# Patient Record
Sex: Male | Born: 1940 | Race: White | Hispanic: No | Marital: Married | Smoking: Never smoker
Health system: Southern US, Community
[De-identification: ages and names within clinical notes are randomized; demographics above are authoritative.]

## PROBLEM LIST (undated history)

## (undated) DIAGNOSIS — C61 Malignant neoplasm of prostate: Secondary | ICD-10-CM

## (undated) DIAGNOSIS — Z860100 Personal history of colon polyps, unspecified: Secondary | ICD-10-CM

## (undated) DIAGNOSIS — Z8673 Personal history of transient ischemic attack (TIA), and cerebral infarction without residual deficits: Secondary | ICD-10-CM

## (undated) DIAGNOSIS — Z974 Presence of external hearing-aid: Secondary | ICD-10-CM

## (undated) DIAGNOSIS — K6289 Other specified diseases of anus and rectum: Secondary | ICD-10-CM

## (undated) DIAGNOSIS — N281 Cyst of kidney, acquired: Secondary | ICD-10-CM

## (undated) DIAGNOSIS — K5730 Diverticulosis of large intestine without perforation or abscess without bleeding: Secondary | ICD-10-CM

## (undated) DIAGNOSIS — K219 Gastro-esophageal reflux disease without esophagitis: Secondary | ICD-10-CM

## (undated) DIAGNOSIS — I728 Aneurysm of other specified arteries: Secondary | ICD-10-CM

## (undated) DIAGNOSIS — M1990 Unspecified osteoarthritis, unspecified site: Secondary | ICD-10-CM

## (undated) DIAGNOSIS — Z7901 Long term (current) use of anticoagulants: Secondary | ICD-10-CM

## (undated) DIAGNOSIS — Z87442 Personal history of urinary calculi: Secondary | ICD-10-CM

## (undated) DIAGNOSIS — E781 Pure hyperglyceridemia: Secondary | ICD-10-CM

## (undated) HISTORY — PX: PROSTATE BIOPSY: SHX241

## (undated) HISTORY — PX: CYSTOSCOPY WITH RETROGRADE PYELOGRAM, URETEROSCOPY AND STENT PLACEMENT: SHX5789

## (undated) HISTORY — PX: EXTRACORPOREAL SHOCK WAVE LITHOTRIPSY: SHX1557

## (undated) HISTORY — DX: Aneurysm of other specified arteries: I72.8

## (undated) HISTORY — DX: Unspecified osteoarthritis, unspecified site: M19.90

## (undated) HISTORY — PX: OTHER SURGICAL HISTORY: SHX169

---

## 2001-01-15 ENCOUNTER — Ambulatory Visit (HOSPITAL_COMMUNITY): Admission: RE | Admit: 2001-01-15 | Discharge: 2001-01-15 | Payer: Self-pay | Admitting: Internal Medicine

## 2001-01-15 ENCOUNTER — Encounter: Payer: Self-pay | Admitting: Internal Medicine

## 2002-05-27 ENCOUNTER — Emergency Department (HOSPITAL_COMMUNITY): Admission: EM | Admit: 2002-05-27 | Discharge: 2002-05-27 | Payer: Self-pay | Admitting: Emergency Medicine

## 2002-12-01 ENCOUNTER — Encounter: Payer: Self-pay | Admitting: Internal Medicine

## 2002-12-01 ENCOUNTER — Ambulatory Visit (HOSPITAL_COMMUNITY): Admission: RE | Admit: 2002-12-01 | Discharge: 2002-12-01 | Payer: Self-pay | Admitting: Internal Medicine

## 2002-12-02 ENCOUNTER — Encounter: Payer: Self-pay | Admitting: Internal Medicine

## 2002-12-02 ENCOUNTER — Ambulatory Visit (HOSPITAL_COMMUNITY): Admission: RE | Admit: 2002-12-02 | Discharge: 2002-12-02 | Payer: Self-pay | Admitting: Internal Medicine

## 2003-03-10 ENCOUNTER — Encounter: Payer: Self-pay | Admitting: Family Medicine

## 2003-03-10 ENCOUNTER — Ambulatory Visit (HOSPITAL_COMMUNITY): Admission: RE | Admit: 2003-03-10 | Discharge: 2003-03-10 | Payer: Self-pay | Admitting: Family Medicine

## 2005-02-07 ENCOUNTER — Other Ambulatory Visit: Admission: RE | Admit: 2005-02-07 | Discharge: 2005-02-07 | Payer: Self-pay | Admitting: Unknown Physician Specialty

## 2005-07-05 ENCOUNTER — Ambulatory Visit (HOSPITAL_COMMUNITY): Admission: RE | Admit: 2005-07-05 | Discharge: 2005-07-05 | Payer: Self-pay | Admitting: Urology

## 2005-07-25 ENCOUNTER — Ambulatory Visit (HOSPITAL_COMMUNITY): Admission: RE | Admit: 2005-07-25 | Discharge: 2005-07-25 | Payer: Self-pay | Admitting: Urology

## 2005-07-25 ENCOUNTER — Ambulatory Visit (HOSPITAL_BASED_OUTPATIENT_CLINIC_OR_DEPARTMENT_OTHER): Admission: RE | Admit: 2005-07-25 | Discharge: 2005-07-25 | Payer: Self-pay | Admitting: Urology

## 2006-08-12 ENCOUNTER — Encounter (INDEPENDENT_AMBULATORY_CARE_PROVIDER_SITE_OTHER): Payer: Self-pay | Admitting: Specialist

## 2006-08-12 ENCOUNTER — Ambulatory Visit (HOSPITAL_COMMUNITY): Admission: RE | Admit: 2006-08-12 | Discharge: 2006-08-12 | Payer: Self-pay | Admitting: General Surgery

## 2006-08-12 HISTORY — PX: COLONOSCOPY: SHX174

## 2007-08-12 ENCOUNTER — Ambulatory Visit (HOSPITAL_COMMUNITY): Admission: RE | Admit: 2007-08-12 | Discharge: 2007-08-12 | Payer: Self-pay | Admitting: Family Medicine

## 2010-12-14 NOTE — Procedures (Signed)
   NAME:  Louis Barnett, Louis Barnett NO.:  [id number]   MEDICAL RECORD NO.:  [id number]                   PATIENT TYPE:  OUT   LOCATION:  RAD                                  FACILITY:  [facility]   PHYSICIAN:  Darlin Priestly, M.D.             DATE OF BIRTH:  1940-11-04   DATE OF PROCEDURE:  12/02/2002  DATE OF DISCHARGE:                                  ECHOCARDIOGRAM   INDICATIONS:  The patient is a 70 year old male patient of Dr. Artis Delay  with a history of TIA.  He is now referred for 2-D echocardiogram to  evaluate LV function, valvular structures, and rule out intracardiac  thrombus.   FINDINGS:  The aorta is within normal limits at 3.1 cm.   The left atrium is within normal limits at 3.7 cm.  There are not clots  seen.  The patient is in sinus rhythm during procedure.   IVS and LAD are within normal limits of 1.1 and 1.0 cm, respectively.   The aortic valve appears to be mildly thickened with no evidence of  significant aortic stenosis and trivial to mild aortic regurgitation.   There is mild thickening of the anterior and posterior mitral valve leaflets  with trivial mitral regurgitation.   Structurally normal tricuspid valve with mild tricuspid regurgitation.   Left ventricular internal dimensions within normal limits at 4.2 and 3.3 cm,  respectively.  There is good overall left ventricular function, estimated EF  of 60% with no segmental wall motion abnormalities visualized.   Mild RV enlargement with normal RV systolic function.   CONCLUSIONS:  1. Normal left ventricular size and systolic function estimated ejection     fraction of 60%.  2. Mildly thickened aortic valve with no evidence of significant aortic     stenosis and trivial to mild aortic regurgitation.  3. Mild mitral valve thickening with trivial mitral regurgitation.  4.     Structurally normal tricuspid valve with mild tricuspid regurgitation.  5. Mild right ventricular  enlargement with normal right ventricular systolic     function.  6. No evidence of intracardiac mass or thrombus noted.                                               Darlin Priestly, M.D.    RHM/MEDQ  D:  12/02/2002  T:  12/03/2002  Job:  [id number]   cc:   Madelin Rear. Sherwood Gambler, M.D.  [address]  [place]  [place] [address]  Fax: [phone]   Kem Boroughs, M.D.  [facility] [address]  [place]  [place] [address]  Fax: [phone]

## 2010-12-14 NOTE — Op Note (Signed)
NAME:  Louis Barnett, Louis Barnett NO.:  [id number]   MEDICAL RECORD NO.:  [id number]          PATIENT TYPE:  AMB   LOCATION:  DAY                          FACILITY:  [facility]   PHYSICIAN:  Heloise Purpura, MD      DATE OF BIRTH:  Oct 20, 1940   DATE OF PROCEDURE:  07/05/2005  DATE OF DISCHARGE:                                 OPERATIVE REPORT   PREOPERATIVE DIAGNOSES:  1.  Right ureteral calculus.  2.  Bilateral renal calculi.   POSTOPERATIVE DIAGNOSES:  1.  Right ureteral calculus.  2.  Bilateral renal calculi.   PROCEDURE:  1.  Cystoscopy.  2.  Right retrograde pyelography.  3.  Right ureteroscopy.  4.  Right ureteral stent placement.   SURGEON:  Crecencio Mc, M.D.   ANESTHESIA:  General.   COMPLICATIONS:  None.   INDICATIONS FOR PROCEDURE:  Louis Barnett is a 70 year old gentleman who was  seen in the office earlier today after experiencing severe right sided flank  pain. He was initially evaluated with a CT scan approximately 2 days ago and  found to have a 4 mm distal right ureteral calculus. His pain was fairly  well controlled in the office, but after discussing options with the  patient, he elected to proceed with surgical removal at this time. The  potential risks and benefits of this procedure were discussed with the  patient and he consented.   DESCRIPTION OF PROCEDURE:  The patient was taken to the operating room and a  general anesthetic was administered. The patient was given preoperative  antibiotics, placed in the dorsal lithotomy position, prepped and draped in  the usual sterile fashion. A preoperative time-out was performed. Next,  cystourethroscopy was performed with the 12 degree scope. This revealed the  ureteral orifices to be in the normal anatomic position with clear efflux  from the left ureteral orifice. There was no evidence of any bladder tumors,  stones, or other mucosal pathology. Attention then turned to the right  ureteral orifice. A 6  French open ended catheter was then passed distally  into the ureteral orifice under wire guidance. The wire was then removed and  contrast was injected. This demonstrated a filling defect in the distal  ureter consistent with the stone which was identified on fluoroscopy. A  0.038 sensor guidewire was then passed up via the ureteral catheter into the  renal pelvis under fluoroscopic guidance. An attempt was then made to  perform ureteroscopy with a 6.4 French ureteroscope. The distal ureter was  able to be entered. However, the ureteroscope could not be advanced past a  narrowing in the distal ureter. It was then decided to perform balloon  dilation. The 4 cm ureteral balloon dilator was passed over the wire under  cystoscopic guidance and inflated. There was noted to be a small amount of  waist which did not disappear. Of note, this was noted to be well below the  area of the stone which could be seen on fluoroscopy. However, another  attempt at ureteroscopy was performed. Unfortunately, this narrowed  strictured area could  not be traversed. Therefore, it was decided to place a  ureteral stent to allow passive dilation. A 6 x 26 double-J ureteral stent  was passed over the guidewire which had been back loaded on the cystoscope.  It was then appropriately positioned under fluoroscopic and cystoscopic  guidance and the wire was removed with a good curl noted in the renal pelvis  as well as in the bladder. The patient's bladder was then emptied. There  were no complications and the patient tolerated the procedure well. He was  able to be awakened and transferred to the recovery unit in satisfactory  condition.           ______________________________  Heloise Purpura, MD  Electronically Signed     LB/MEDQ  D:  07/05/2005  T:  07/05/2005  Job:  [id number]

## 2010-12-14 NOTE — Op Note (Signed)
NAME:  Louis, Barnett NO.:  [id number]   MEDICAL RECORD NO.:  [id number]          PATIENT TYPE:  AMB   LOCATION:  [facility]                         FACILITY:  [facility]   PHYSICIAN:  Heloise Purpura, MD      DATE OF BIRTH:  05/20/1941   DATE OF PROCEDURE:  07/25/2005  DATE OF DISCHARGE:                                 OPERATIVE REPORT   PREOPERATIVE DIAGNOSIS:  Right ureteral calculus.   POSTOPERATIVE DIAGNOSIS:  Right ureteral calculus.   PROCEDURE:  1.  Cystoscopy.  2.  Removal of double-J stent.  3.  Right ureteroscopic stone removal.   SURGEON:  Dr. Heloise Purpura.   ANESTHESIA:  LMA.   ESTIMATED BLOOD LOSS:  Minimal.   INDICATIONS:  Louis Barnett is a 70 year old gentleman who recently was found to  have an obstructing distal right ureteral calculus measuring approximately 4  mm. He initially decided to proceed with ureteroscopic intervention. At the  time of his initial procedure, he was noted to have a very narrow distal  ureter which would not dilate and would not allow the ureteroscope to be  manipulated past. A stent was subsequently placed to relieve his obstruction  and improve his pain. He now presents for definitive management of his  ureteral calculus and reassessment for possible persistent ureteral  stricture. The potential risks and benefits of this procedure were discussed  with the patient and he consented.   DESCRIPTION OF PROCEDURE:  The patient was taken to the operating room and a  general anesthetic was administered. The patient was given preoperative  antibiotics, placed in the dorsal lithotomy position, and prepped and draped  in the usual sterile fashion. A preoperative time-out was performed. Next,  cystourethroscopy was performed. This demonstrated a normal anterior and  posterior urethra. Examination of the bladder demonstrated no evidence of  any other bladder pathology or abnormalities except for an indwelling right  ureteral stent.  The stent was then grabbed with the flexible grasper and  pulled out to the urethral meatus. A 0.038 guidewire was then inserted  through the stent and up into the renal pelvis under fluoroscopic guidance.  The cystoscope was then removed and a semi-rigid ureteroscope was used to  intubate the right ureteral orifice. The distal right ureter at this time  appeared to have no abnormalities or strictured areas. The distal right  ureteral calculus was readily apparent and was removed with a nitinol  basket. This stone was then sent for stone analysis. Reinspection of the  distal ureter revealed no further calculi. Due to the atraumatic nature of  the case, it was decided to leave a stent out. The patient's bladder was  emptied and the procedure was ended. There were no complications and the  patient appeared to tolerate the procedure well. He was able to be awakened  and transferred to the recovery unit in satisfactory condition.   FOLLOW-UP:  Louis Barnett will plan to follow-up with me in the next 1-2 months  for reevaluation and also to proceed with a 24-hour urine study.  ______________________________  Heloise Purpura, MD  Electronically Signed     LB/MEDQ  D:  07/25/2005  T:  07/25/2005  Job:  [id number]

## 2011-07-15 ENCOUNTER — Other Ambulatory Visit: Payer: Self-pay | Admitting: Urology

## 2011-07-15 ENCOUNTER — Encounter (HOSPITAL_COMMUNITY): Payer: Self-pay | Admitting: Pharmacy Technician

## 2011-07-16 ENCOUNTER — Encounter (HOSPITAL_COMMUNITY): Payer: Self-pay | Admitting: *Deleted

## 2011-07-16 NOTE — Pre-Procedure Instructions (Signed)
Pt wife instructed hibiclens shower hs and am of surgery, neck down avoid private area

## 2011-07-17 NOTE — H&P (Signed)
Active Problems Problems  1. Microscopic Hematuria 599.72 2. Nephrolithiasis 592.0 3. Ureteral Stone Left 592.1  History of Present Illness  Louis Barnett presents today for ureteroscopic management of his left distal stone.  He is a 70 yo WM with a history of stones who had the onsetprior to his recent office visit of left flank apin.   The pain became severe and had some associated N/V but he had stomach virus about the same time.  He has had no gross hematuria or voiding symptoms.  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   He is feel better right now but had a bout this morning.  He has had prior ureteroscopy and his stones are calcium oxalate.   Past Medical History Problems  1. History of  Arthritis V13.4 2. History of  Esophageal Reflux 530.81 3. History of  Hypercholesterolemia 272.0 4. History of  Nephrolithiasis V13.01 5. History of  Transient Ischemic Attack 435.9  Surgical History Problems  1. History of  Cystoscopy With Ureteroscopy With Removal Of Calculus  Current Meds 1. Cipro 500 MG Oral Tablet; Therapy: (Recorded:17Dec2012) to 2. Oxycodone-Acetaminophen 5-325 MG Oral Tablet; Therapy: (Recorded:17Dec2012) to 3. Plavix 75 MG Oral Tablet; Therapy: (Recorded:17Dec2012) to 4. Plavix TABS; Therapy: (Recorded:20Jan2009) to 5. Promethazine HCl 25 MG Oral Tablet; Therapy: (Recorded:17Dec2012) to 6. Tricor TABS; Therapy: (Recorded:20Jan2009) to  Allergies Medication  1. Penicillins  Family History Problems  1. Paternal history of  Acute Myocardial Infarction V17.3 2. Paternal history of  Death In The Family Father 21 3. Paternal history of  Death In The Family Mother 75 4. 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  Genitourinary: urinary frequency, feelings of urinary urgency, weak urinary stream and erectile dysfunction.  Gastrointestinal: nausea, vomiting, abdominal pain and diarrhea.  Hematologic/Lymphatic: a tendency to easily bruise.  Musculoskeletal: back pain and joint pain.    Vitals Vital Signs [Data Includes: Last 1 Day]  17Dec2012 11:22AM  Blood Pressure: 114 / 71 Temperature: 97 F Heart Rate: 60 17Dec2012 11:19AM  BMI Calculated: 25.76 BSA Calculated: 1.91 Height: 5 ft 8 in Weight: 170 lb   Physical Exam Constitutional: Well nourished and well developed . No acute distress.  ENT:. The ears and nose are normal in appearance.  Neck: The appearance of the neck is normal and no neck mass is present.  Pulmonary: No respiratory distress and normal respiratory rhythm and effort.  Cardiovascular: Heart rate and rhythm are normal . No peripheral edema.  Abdomen: The abdomen is soft and nontender. No masses are palpated. No CVA tenderness. No hernias are palpable. No hepatosplenomegaly noted.  Skin: Normal skin turgor, no visible rash and no visible skin lesions.  Neuro/Psych:. Mood and affect are appropriate.    Results/Data Urine [Data Includes: Last 1 Day]   17Dec2012  COLOR YELLOW   APPEARANCE CLEAR   SPECIFIC GRAVITY 1.025   pH 5.5   GLUCOSE  NEG mg/dL  BILIRUBIN NEG   KETONE NEG mg/dL  BLOOD SMALL   PROTEIN TRACE mg/dL  UROBILINOGEN 0.2 mg/dL  NITRITE NEG   LEUKOCYTE ESTERASE NEG   SQUAMOUS EPITHELIAL/HPF RARE   WBC 7-10 WBC/hpf  RBC 0-3 RBC/hpf  BACTERIA RARE   CRYSTALS NONE SEEN     CASTS NONE SEEN   Other Mucus noted.    Old records or history reviewed: Records from the [facility] ER reviewed.  The following images/tracing/specimen were independently visualized:  KUB today shows a 4mm RLP stone and a 4mm left distal stone. No other significant abnormalities are noted.  The following radiology reports were reviewed: CT from [facility] reviewed.    Assessment Assessed  1. Ureteral Stone Left 592.1 2. Nephrolithiasis 592.0   He has a 4mm intermittantly symptomatic left distal stone. 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 He has small renal lesions that will need MRI.   Plan Health Maintenance (V70.0)  1. UA With REFLEX  Done: 17Dec2012 10:44AM Ureteral Stone (592.1)  2. Rapaflo 8 MG Oral Capsule; TAKE 1 CAPSULE DAILY WITH FOOD; Therapy: 17Dec2012 to  (Evaluate:31Dec2012); Last Rx:17Dec2012 3. KUB  Done: 17Dec2012 12:00AM 4. Follow-up Schedule Surgery Office  Follow-up  Requested for: 17Dec2012   I discuss the addition of rapaflo to his meds and consideration of ureteroscopy vs ESWL.  At this time, I will add the Rapaflo and have reviewed the side effects and will get him set up for left ureteroscopic stone extraction later this week.  The risks of bleeding, infection, need for stent or secondary procedures, thrombotic events and anesthetic complications were reviewed.  He will stop his plavix today.

## 2011-07-18 ENCOUNTER — Encounter (HOSPITAL_COMMUNITY): Payer: Self-pay | Admitting: Anesthesiology

## 2011-07-18 ENCOUNTER — Encounter (HOSPITAL_COMMUNITY): Payer: Self-pay | Admitting: *Deleted

## 2011-07-18 ENCOUNTER — Ambulatory Visit (HOSPITAL_COMMUNITY): Payer: Medicare Other | Admitting: Anesthesiology

## 2011-07-18 ENCOUNTER — Other Ambulatory Visit: Payer: Self-pay

## 2011-07-18 ENCOUNTER — Ambulatory Visit (HOSPITAL_COMMUNITY)
Admission: RE | Admit: 2011-07-18 | Discharge: 2011-07-18 | Disposition: A | Discharge status: Home / Self Care | Payer: Medicare Other | Source: Ambulatory Visit | Attending: Urology | Admitting: Urology

## 2011-07-18 ENCOUNTER — Ambulatory Visit (HOSPITAL_COMMUNITY): Payer: Medicare Other

## 2011-07-18 ENCOUNTER — Encounter (HOSPITAL_COMMUNITY)
Admission: RE | Disposition: A | Discharge status: Home / Self Care | Payer: Self-pay | Source: Ambulatory Visit | Attending: Urology

## 2011-07-18 DIAGNOSIS — E7800 Pure hypercholesterolemia, unspecified: Secondary | ICD-10-CM | POA: Insufficient documentation

## 2011-07-18 DIAGNOSIS — N201 Calculus of ureter: Secondary | ICD-10-CM | POA: Insufficient documentation

## 2011-07-18 DIAGNOSIS — Z7902 Long term (current) use of antithrombotics/antiplatelets: Secondary | ICD-10-CM | POA: Insufficient documentation

## 2011-07-18 DIAGNOSIS — Z79899 Other long term (current) drug therapy: Secondary | ICD-10-CM | POA: Insufficient documentation

## 2011-07-18 DIAGNOSIS — R3129 Other microscopic hematuria: Secondary | ICD-10-CM | POA: Insufficient documentation

## 2011-07-18 DIAGNOSIS — N200 Calculus of kidney: Secondary | ICD-10-CM | POA: Insufficient documentation

## 2011-07-18 DIAGNOSIS — K219 Gastro-esophageal reflux disease without esophagitis: Secondary | ICD-10-CM | POA: Insufficient documentation

## 2011-07-18 DIAGNOSIS — Z8673 Personal history of transient ischemic attack (TIA), and cerebral infarction without residual deficits: Secondary | ICD-10-CM | POA: Insufficient documentation

## 2011-07-18 HISTORY — DX: Gastro-esophageal reflux disease without esophagitis: K21.9

## 2011-07-18 HISTORY — DX: Pure hyperglyceridemia: E78.1

## 2011-07-18 HISTORY — PX: CYSTOSCOPY WITH URETEROSCOPY: SHX5123

## 2011-07-18 LAB — CBC
MCH: 32.2 pg (ref 26.0–34.0)
MCHC: 35.3 g/dL (ref 30.0–36.0)
MCV: 91.4 fL (ref 78.0–100.0)
Platelets: 138 10*3/uL — ABNORMAL LOW (ref 150–400)

## 2011-07-18 LAB — BASIC METABOLIC PANEL
BUN: 23 mg/dL (ref 6–23)
Chloride: 104 mEq/L (ref 96–112)
Creatinine, Ser: 1.47 mg/dL — ABNORMAL HIGH (ref 0.50–1.35)
GFR calc Af Amer: 54 mL/min — ABNORMAL LOW (ref >90)
GFR calc non Af Amer: 47 mL/min — ABNORMAL LOW (ref >90)
Glucose, Bld: 107 mg/dL — ABNORMAL HIGH (ref 70–99)

## 2011-07-18 LAB — SURGICAL PCR SCREEN: MRSA, PCR: NEGATIVE

## 2011-07-18 SURGERY — CYSTOSCOPY WITH URETEROSCOPY
Anesthesia: General

## 2011-07-18 MED ORDER — SODIUM CHLORIDE 0.9 % IR SOLN
Status: DC | PRN
  Administered 2011-07-18: 1000 mL

## 2011-07-18 MED ORDER — LACTATED RINGERS IV SOLN: INTRAVENOUS | Status: DC

## 2011-07-18 MED ORDER — ONDANSETRON HCL 4 MG/2ML IJ SOLN
INTRAMUSCULAR | Status: DC | PRN
  Administered 2011-07-18: 4 mg via INTRAVENOUS

## 2011-07-18 MED ORDER — MUPIROCIN 2 % EX OINT
TOPICAL_OINTMENT | CUTANEOUS | Status: AC
  Filled 2011-07-18: qty 22

## 2011-07-18 MED ORDER — PROMETHAZINE HCL 25 MG/ML IJ SOLN
INTRAMUSCULAR | Status: AC
  Filled 2011-07-18: qty 1

## 2011-07-18 MED ORDER — FENTANYL CITRATE 0.05 MG/ML IJ SOLN: 25.0000 ug | INTRAMUSCULAR | Status: DC | PRN

## 2011-07-18 MED ORDER — LACTATED RINGERS IV SOLN
INTRAVENOUS | Status: DC
  Administered 2011-07-18: 14:00:00 via INTRAVENOUS

## 2011-07-18 MED ORDER — ACETAMINOPHEN 10 MG/ML IV SOLN
INTRAVENOUS | Status: DC | PRN
  Administered 2011-07-18: 1000 mg via INTRAVENOUS

## 2011-07-18 MED ORDER — FENTANYL CITRATE 0.05 MG/ML IJ SOLN
INTRAMUSCULAR | Status: DC | PRN
  Administered 2011-07-18: 100 ug via INTRAVENOUS

## 2011-07-18 MED ORDER — BELLADONNA ALKALOIDS-OPIUM 16.2-60 MG RE SUPP
RECTAL | Status: DC | PRN
  Administered 2011-07-18: 1 via RECTAL

## 2011-07-18 MED ORDER — MIDAZOLAM HCL 5 MG/5ML IJ SOLN
INTRAMUSCULAR | Status: DC | PRN
  Administered 2011-07-18: 2 mg via INTRAVENOUS

## 2011-07-18 MED ORDER — HYOSCYAMINE SULFATE 0.125 MG SL SUBL: 0.1250 mg | SUBLINGUAL_TABLET | SUBLINGUAL | Status: AC | PRN

## 2011-07-18 MED ORDER — CIPROFLOXACIN IN D5W 400 MG/200ML IV SOLN
400.0000 mg | INTRAVENOUS | Status: AC
  Administered 2011-07-18: 400 mg via INTRAVENOUS
  Filled 2011-07-18: qty 200

## 2011-07-18 MED ORDER — CIPROFLOXACIN IN D5W 400 MG/200ML IV SOLN
INTRAVENOUS | Status: AC
  Filled 2011-07-18: qty 200

## 2011-07-18 MED ORDER — PROMETHAZINE HCL 25 MG/ML IJ SOLN
6.2500 mg | INTRAMUSCULAR | Status: AC | PRN
  Administered 2011-07-18 (×2): 6.25 mg via INTRAVENOUS

## 2011-07-18 MED ORDER — LACTATED RINGERS IV SOLN
INTRAVENOUS | Status: DC | PRN
  Administered 2011-07-18: 13:00:00 via INTRAVENOUS

## 2011-07-18 MED ORDER — PHENAZOPYRIDINE HCL 200 MG PO TABS: 200.0000 mg | ORAL_TABLET | Freq: Three times a day (TID) | ORAL | Status: AC | PRN

## 2011-07-18 MED ORDER — PROPOFOL 10 MG/ML IV BOLUS
INTRAVENOUS | Status: DC | PRN
  Administered 2011-07-18: 180 mg via INTRAVENOUS

## 2011-07-18 MED ORDER — BELLADONNA ALKALOIDS-OPIUM 16.2-60 MG RE SUPP
RECTAL | Status: AC
  Filled 2011-07-18: qty 1

## 2011-07-18 MED ORDER — LIDOCAINE HCL (CARDIAC) 20 MG/ML IV SOLN
INTRAVENOUS | Status: DC | PRN
  Administered 2011-07-18: 100 mg via INTRAVENOUS

## 2011-07-18 MED ORDER — ACETAMINOPHEN 10 MG/ML IV SOLN
INTRAVENOUS | Status: AC
  Filled 2011-07-18: qty 100

## 2011-07-18 MED ORDER — IOHEXOL 300 MG/ML  SOLN
INTRAMUSCULAR | Status: AC
  Filled 2011-07-18: qty 1

## 2011-07-18 SURGICAL SUPPLY — 16 items
BAG URO CATCHER STRL LF (DRAPE) ×2 IMPLANT
BASKET LASER NITINOL 1.9FR (BASKET) ×2 IMPLANT
BSKT STON RTRVL 120 1.9FR (BASKET) ×1
CATH URET 5FR 28IN OPEN ENDED (CATHETERS) ×2 IMPLANT
CLOTH BEACON ORANGE TIMEOUT ST (SAFETY) ×2 IMPLANT
DRAPE CAMERA CLOSED 9X96 (DRAPES) ×2 IMPLANT
GLOVE SURG SS PI 8.0 STRL IVOR (GLOVE) ×2 IMPLANT
GOWN PREVENTION PLUS XLARGE (GOWN DISPOSABLE) ×2 IMPLANT
GOWN STRL REIN XL XLG (GOWN DISPOSABLE) ×2 IMPLANT
KIT BALLN UROMAX 15FX4 (MISCELLANEOUS) IMPLANT
KIT BALLN UROMAX 26 75X4 (MISCELLANEOUS) ×1
MANIFOLD NEPTUNE II (INSTRUMENTS) ×2 IMPLANT
PACK CYSTO (CUSTOM PROCEDURE TRAY) ×2 IMPLANT
SHEATH URET ACCESS 12FR/35CM (UROLOGICAL SUPPLIES) ×1 IMPLANT
STENT CONTOUR URETERAL (STENTS) ×1 IMPLANT
TUBING CONNECTING 10 (TUBING) ×2 IMPLANT

## 2011-07-18 NOTE — Anesthesia Postprocedure Evaluation (Signed)
  Anesthesia Post-op Note  Patient: Louis Barnett  Procedure(s) Performed:  CYSTOSCOPY WITH URETEROSCOPY - Possible Stent  Patient Location: PACU  Anesthesia Type: General  Level of Consciousness: awake and alert   Airway and Oxygen Therapy: Patient Spontanous Breathing  Post-op Pain: mild  Post-op Assessment: Post-op Vital signs reviewed, Patient's Cardiovascular Status Stable, Respiratory Function Stable, Patent Airway and No signs of Nausea or vomiting  Post-op Vital Signs: stable  Complications: No apparent anesthesia complications

## 2011-07-18 NOTE — Progress Notes (Signed)
To Radiologgy for CXR and KUB by wheelchair

## 2011-07-18 NOTE — Interval H&P Note (Signed)
History and Physical Interval Note:  07/18/2011 10:42 AM  Louis Barnett  has presented today for surgery, with the diagnosis of left distal stone  The various methods of treatment have been discussed with the patient and family. After consideration of risks, benefits and other options for treatment, the patient has consented to  Procedure(s): CYSTOSCOPY WITH URETEROSCOPY as a surgical intervention .  The patients' history has been reviewed, patient examined, no change in status, stable for surgery.  I have reviewed the patients' chart and labs.  Questions were answered to the patient's satisfaction.     [name] J

## 2011-07-18 NOTE — Anesthesia Preprocedure Evaluation (Addendum)
Anesthesia Evaluation  Patient identified by MRN, date of birth, ID band Patient awake    Reviewed: Allergy & Precautions, H&P , NPO status , Patient's Chart, lab work & pertinent test results  Airway Mallampati: II TM Distance: >3 FB Neck ROM: full    Dental  (+) Caps and Dental Advisory Given,    Pulmonary neg pulmonary ROS,  clear to auscultation  Pulmonary exam normal       Cardiovascular Exercise Tolerance: Good neg cardio ROS regular Normal    Neuro/Psych tia 10 years ago. Resolved with plavix. TIANegative Neurological ROS  Negative Psych ROS   GI/Hepatic negative GI ROS, Neg liver ROS, GERD-  Controlled and Medicated,  Endo/Other  Negative Endocrine ROS  Renal/GU negative Renal ROS  Genitourinary negative   Musculoskeletal   Abdominal   Peds  Hematology negative hematology ROS (+)   Anesthesia Other Findings   Reproductive/Obstetrics negative OB ROS                        Anesthesia Physical Anesthesia Plan  ASA: III  Anesthesia Plan: General   Post-op Pain Management:    Induction: Intravenous  Airway Management Planned: LMA  Additional Equipment:   Intra-op Plan:   Post-operative Plan:   Informed Consent: I have reviewed the patients History and Physical, chart, labs and discussed the procedure including the risks, benefits and alternatives for the proposed anesthesia with the patient or authorized representative who has indicated his/her understanding and acceptance.   Dental Advisory Given  Plan Discussed with: CRNA  Anesthesia Plan Comments:        Anesthesia Quick Evaluation

## 2011-07-18 NOTE — Transfer of Care (Signed)
Immediate Anesthesia Transfer of Care Note  Patient: Louis Barnett  Procedure(s) Performed:  CYSTOSCOPY WITH URETEROSCOPY - Possible Stent  Patient Location: PACU  Anesthesia Type: General  Level of Consciousness: awake and alert   Airway & Oxygen Therapy: Patient Spontanous Breathing and Patient connected to face mask oxygen  Post-op Assessment: Report given to PACU RN and Post -op Vital signs reviewed and stable  Post vital signs: Reviewed and stable  Complications: No apparent anesthesia complications

## 2011-07-19 NOTE — Op Note (Signed)
NAME:  Louis Barnett, Louis Barnett NO.:  [id number]  MEDICAL RECORD NO.:  [id number]  LOCATION:  [facility]                         FACILITY:  [facility]  PHYSICIAN:  Excell Seltzer. Annabell Howells, M.D.    DATE OF BIRTH:  04/13/1941  DATE OF PROCEDURE:  07/18/2011 DATE OF DISCHARGE:  07/18/2011                              OPERATIVE REPORT   Patient of Dr. Bjorn Pippin.  PREOPERATIVE DIAGNOSIS:  Left distal stone.  POSTOPERATIVE DIAGNOSIS:  Left distal stone.  PROCEDURES:  Cystoscopy, left retrograde pyelogram with interpretation, left ureteroscopic stone extraction.  SURGEON:  Excell Seltzer. Annabell Howells, M.D.  ANESTHESIA:  General.  SPECIMEN:  Stone.  DRAINS:  6 French 24 cm left double-J stent.  COMPLICATIONS:  None.  INDICATIONS:  [name] is a 70 year old white male who has a 4-mm left distal ureteral stone, that needs ureteroscopic stone extraction.  FINDINGS OF PROCEDURE:  He was given Cipro.  He was taken to the operating room where general anesthetic was induced.  He was placed in the lithotomy position and fitted with PAS hose.  Perineum and genitalia were prepped with Betadine solution and he was draped in the usual sterile fashion.  Cystoscopy was performed using 22-French scope and a 12-degree lens. Examination revealed a normal urethra.  The external sphincter was intact.  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  There was a small middle lobe. Examination of bladder revealed moderate trabeculation with cellules. No bladder wall lesions were noted.  Ureteral orifices were unremarkable, though rather tight.  The left ureteral orifice was cannulated with a 5-French open-ended catheter.  Contrast was instilled.  This revealed some  narrowing of the distal ureter and a stone proximal to the narrowed area.  At this point, a guidewire was passed to the kidney by the stone and the cystoscope was removed and access sheath inner core dilator  12-French was placed over the wire, but I was not able to advance it to the level of stone.  A 4 cm 15-French high-pressure balloon was then placed over the wire to just below the stone and inflated to 18 atmospheres.  There was a tight waist, which eventually relented.  The balloon was left inflated for a minute, then deflated and removed.  A 6-French short ureteroscope was then inserted.  The stone was identified and grasped with Nitinol basket and removed without difficulty.  The cystoscope was reinserted over the wire, a 6-French 24 cm double-J stent with string was passed to the kidney under fluoroscopic guidance.  The wire was removed leaving good  coil in the kidney, a good coil in the bladder.  The bladder was drained.  The cystoscope was removed leaving the stent string exiting urethra.  A B and O suppository was placed.  The patient was taken down from lithotomy position.  His anesthetic was reversed.  He was moved to Recovery in stable condition.     Excell Seltzer. Annabell Howells, M.D.     JJW/MEDQ  D:  07/18/2011  T:  07/19/2011  Job:  [id number]

## 2011-07-26 ENCOUNTER — Encounter (HOSPITAL_COMMUNITY): Payer: Self-pay | Admitting: Urology

## 2011-07-26 NOTE — OR Nursing (Signed)
Chart (anesthesia type and procedure) corrected by Adrian Blackwater, RN AD Wilkie Aye, 07/26/2011

## 2011-08-06 ENCOUNTER — Other Ambulatory Visit: Payer: Self-pay | Admitting: Urology

## 2011-08-06 DIAGNOSIS — R3129 Other microscopic hematuria: Secondary | ICD-10-CM | POA: Diagnosis not present

## 2011-08-06 DIAGNOSIS — N201 Calculus of ureter: Secondary | ICD-10-CM | POA: Diagnosis not present

## 2011-08-06 DIAGNOSIS — N289 Disorder of kidney and ureter, unspecified: Secondary | ICD-10-CM

## 2011-08-06 DIAGNOSIS — N200 Calculus of kidney: Secondary | ICD-10-CM | POA: Diagnosis not present

## 2011-08-06 DIAGNOSIS — R161 Splenomegaly, not elsewhere classified: Secondary | ICD-10-CM

## 2011-08-06 DIAGNOSIS — I728 Aneurysm of other specified arteries: Secondary | ICD-10-CM | POA: Diagnosis not present

## 2011-08-10 ENCOUNTER — Ambulatory Visit (HOSPITAL_COMMUNITY)
Admission: RE | Admit: 2011-08-10 | Discharge: 2011-08-10 | Disposition: A | Discharge status: Home / Self Care | Payer: Medicare Other | Source: Ambulatory Visit | Attending: Urology | Admitting: Urology

## 2011-08-10 ENCOUNTER — Other Ambulatory Visit: Payer: Self-pay | Admitting: Urology

## 2011-08-10 DIAGNOSIS — N289 Disorder of kidney and ureter, unspecified: Secondary | ICD-10-CM

## 2011-08-10 DIAGNOSIS — K7689 Other specified diseases of liver: Secondary | ICD-10-CM | POA: Diagnosis not present

## 2011-08-10 DIAGNOSIS — Z87442 Personal history of urinary calculi: Secondary | ICD-10-CM | POA: Diagnosis not present

## 2011-08-10 DIAGNOSIS — N281 Cyst of kidney, acquired: Secondary | ICD-10-CM | POA: Diagnosis not present

## 2011-08-10 DIAGNOSIS — R161 Splenomegaly, not elsewhere classified: Secondary | ICD-10-CM

## 2011-08-10 DIAGNOSIS — I517 Cardiomegaly: Secondary | ICD-10-CM | POA: Insufficient documentation

## 2011-08-10 DIAGNOSIS — Z1389 Encounter for screening for other disorder: Secondary | ICD-10-CM | POA: Diagnosis not present

## 2011-08-10 DIAGNOSIS — C649 Malignant neoplasm of unspecified kidney, except renal pelvis: Secondary | ICD-10-CM | POA: Diagnosis not present

## 2011-08-10 DIAGNOSIS — I714 Abdominal aortic aneurysm, without rupture: Secondary | ICD-10-CM | POA: Diagnosis not present

## 2011-08-10 DIAGNOSIS — I728 Aneurysm of other specified arteries: Secondary | ICD-10-CM | POA: Insufficient documentation

## 2011-08-10 MED ORDER — GADOBENATE DIMEGLUMINE 529 MG/ML IV SOLN
15.0000 mL | Freq: Once | INTRAVENOUS | Status: AC | PRN
  Administered 2011-08-10: 15 mL via INTRAVENOUS

## 2011-08-21 ENCOUNTER — Encounter: Payer: Self-pay | Admitting: Vascular Surgery

## 2011-08-26 ENCOUNTER — Encounter: Payer: Self-pay | Admitting: Vascular Surgery

## 2011-08-27 ENCOUNTER — Ambulatory Visit (INDEPENDENT_AMBULATORY_CARE_PROVIDER_SITE_OTHER): Payer: Medicare Other | Admitting: Vascular Surgery

## 2011-08-27 ENCOUNTER — Encounter: Payer: Self-pay | Admitting: Vascular Surgery

## 2011-08-27 VITALS — BP 132/91 | HR 86 | Resp 20 | Ht 68.0 in | Wt 165.0 lb

## 2011-08-27 DIAGNOSIS — I728 Aneurysm of other specified arteries: Secondary | ICD-10-CM | POA: Diagnosis not present

## 2011-08-27 NOTE — Progress Notes (Signed)
[facility]   Patient name: Louis Barnett MRN: [id number] DOB: 07-17-1941 Sex: male   Referred by: Louis Barnett  Reason for referral:  Chief Complaint  Patient presents with  . Aneurysm    Splenic artery aneurysm   REF-->> Dr. Bjorn Barnett    HISTORY OF PRESENT ILLNESS: The patient is an active healthy 71 year old gentleman who recently had a kidney stone. 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 He is seen today for further evaluation of this. He has no symptoms referable to this and has no other history of aneurysmal disease. He does have a history of a blurred vision probable TIA 10 years ago and has been on Plavix. He reports workup at that time revealed no other evidence of identifiable cause. He has no history of cardiac disease.  Past Medical History  Diagnosis Date  . TIA (transient ischemic attack) 20002  . GERD (gastroesophageal reflux disease)   . High triglycerides   . Arthritis   . History of nephrolithiasis     Past Surgical History  Procedure Date  . Cystoscopy 2007  . Cystoscopy with ureteroscopy 07/18/2011    Procedure: CYSTOSCOPY WITH URETEROSCOPY;  Surgeon: Louis Barnett;  Location: [facility];  Service: Urology;  Laterality: N/A;    History   Social History  . Marital Status: Married    Spouse Name: N/A    Number of Children: N/A  . Years of Education: N/A   Occupational History  . Not on file.   Social History Main Topics  . Smoking status: Never Smoker   . Smokeless tobacco: Never Used  . Alcohol Use: No  . Drug Use: No  . Sexually Active: Not on file   Other Topics Concern  . Not on file   Social History Narrative  . No narrative on file    Family History  Problem Relation Age of Onset  . Heart disease Father     heart attack  . Other Mother     varicose veins  . Diabetes Brother   . Heart disease Brother     heart attack    Allergies as of 08/27/2011 - Review  Complete 08/27/2011  Allergen Reaction Noted  . Penicillins Rash 07/15/2011    Current Outpatient Prescriptions on File Prior to Visit  Medication Sig Dispense Refill  . clopidogrel (PLAVIX) 75 MG tablet Take 75 mg by mouth every morning.        . fenofibrate (TRICOR) 48 MG tablet Take 48 mg by mouth every morning.       . fish oil-omega-3 fatty acids 1000 MG capsule Take 1 g by mouth daily.           REVIEW OF SYSTEMS:  Positives indicated with an "X"  CARDIOVASCULAR:  [ ]  chest pain   [ ]  chest pressure   [ ]  palpitations   [ ]  orthopnea   [ ]  dyspnea on exertion   [ ]  claudication   [ ]  rest pain   [ ]  DVT   [ ]  phlebitis PULMONARY:   [ ]  productive cough   [ ]  asthma   [ ]  wheezing NEUROLOGIC:   [ ]  weakness  [ ]  paresthesias  [ ]  aphasia  [ ]  amaurosis  [ ]  dizziness HEMATOLOGIC:   [ ]  bleeding problems   [ ]  clotting disorders MUSCULOSKELETAL:  [ ]  joint pain   [ ]  joint swelling GASTROINTESTINAL: [ ]   blood in stool  [ ]   hematemesis GENITOURINARY:  [ ]   dysuria  [ ]   hematuria PSYCHIATRIC:  [ ]  history of major depression INTEGUMENTARY:  [ ]  rashes  [ ]  ulcers CONSTITUTIONAL:  [ ]  fever   [ ]  chills  PHYSICAL EXAMINATION:  General: The patient is a well-nourished male, in no acute distress. Vital signs are BP 132/91  Pulse 86  Resp 20  Ht 5\' 8"  (1.727 m)  Wt 165 lb (74.844 kg)  BMI 25.09 kg/m2 Pulmonary: There is a good air exchange bilaterally without wheezing or rales. Abdomen: Soft and non-tender with normal pitch bowel sounds. No aneurysm palpable and no bruit present. Musculoskeletal: There are no major deformities.  There is no significant extremity pain. Neurologic: No focal weakness or paresthesias are detected, Skin: There are no ulcer or rashes noted. Psychiatric: The patient has normal affect. Cardiovascular: There is a regular rate and rhythm without significant murmur appreciated. Carotid arteries: No bruits. Pulse status: 2+ radial femoral popliteal  and 2+ posterior tibial pulses bilaterally  CT scan and MRI: These were reviewed and do show a distal splenic artery aneurysm. Maximal diameter is 1.5 cm by CT. This compares to 0.7 cm in 2008  Impression and Plan:  Splenic artery aneurysm, asymptomatic. I discussed this at length with Louis Barnett. I explained symptoms of leaking aneurysm. He does report immediately to emerge from should this occur. I explained this would be quite unlikely at its current size. I have recommended yearly CT scan followup. We will arrange this in one year and see him in the office with this. I explained we would not consider repair of this unless he had  significant continued growth.    Louis Barnett Louis Louis Barnett Office: [phone]

## 2011-10-17 DIAGNOSIS — L570 Actinic keratosis: Secondary | ICD-10-CM | POA: Diagnosis not present

## 2011-10-17 DIAGNOSIS — D235 Other benign neoplasm of skin of trunk: Secondary | ICD-10-CM | POA: Diagnosis not present

## 2011-10-18 DIAGNOSIS — H1045 Other chronic allergic conjunctivitis: Secondary | ICD-10-CM | POA: Diagnosis not present

## 2011-12-31 DIAGNOSIS — M999 Biomechanical lesion, unspecified: Secondary | ICD-10-CM | POA: Diagnosis not present

## 2011-12-31 DIAGNOSIS — M5137 Other intervertebral disc degeneration, lumbosacral region: Secondary | ICD-10-CM | POA: Diagnosis not present

## 2011-12-31 DIAGNOSIS — M5430 Sciatica, unspecified side: Secondary | ICD-10-CM | POA: Diagnosis not present

## 2012-01-02 DIAGNOSIS — Z6825 Body mass index (BMI) 25.0-25.9, adult: Secondary | ICD-10-CM | POA: Diagnosis not present

## 2012-01-02 DIAGNOSIS — M5430 Sciatica, unspecified side: Secondary | ICD-10-CM | POA: Diagnosis not present

## 2012-01-02 DIAGNOSIS — M5137 Other intervertebral disc degeneration, lumbosacral region: Secondary | ICD-10-CM | POA: Diagnosis not present

## 2012-01-02 DIAGNOSIS — M999 Biomechanical lesion, unspecified: Secondary | ICD-10-CM | POA: Diagnosis not present

## 2012-01-06 DIAGNOSIS — E782 Mixed hyperlipidemia: Secondary | ICD-10-CM | POA: Diagnosis not present

## 2012-01-09 ENCOUNTER — Other Ambulatory Visit: Payer: Self-pay | Admitting: Urology

## 2012-01-09 DIAGNOSIS — D49519 Neoplasm of unspecified behavior of unspecified kidney: Secondary | ICD-10-CM

## 2012-01-13 DIAGNOSIS — M722 Plantar fascial fibromatosis: Secondary | ICD-10-CM | POA: Diagnosis not present

## 2012-01-13 DIAGNOSIS — M79609 Pain in unspecified limb: Secondary | ICD-10-CM | POA: Diagnosis not present

## 2012-01-31 ENCOUNTER — Ambulatory Visit (HOSPITAL_COMMUNITY)
Admission: RE | Admit: 2012-01-31 | Discharge: 2012-01-31 | Disposition: A | Discharge status: Home / Self Care | Payer: Medicare Other | Source: Ambulatory Visit | Attending: Urology | Admitting: Urology

## 2012-01-31 ENCOUNTER — Other Ambulatory Visit: Payer: Self-pay | Admitting: Urology

## 2012-01-31 DIAGNOSIS — K654 Sclerosing mesenteritis: Secondary | ICD-10-CM | POA: Insufficient documentation

## 2012-01-31 DIAGNOSIS — Z1389 Encounter for screening for other disorder: Secondary | ICD-10-CM | POA: Diagnosis not present

## 2012-01-31 DIAGNOSIS — Z9889 Other specified postprocedural states: Secondary | ICD-10-CM

## 2012-01-31 DIAGNOSIS — N289 Disorder of kidney and ureter, unspecified: Secondary | ICD-10-CM | POA: Diagnosis not present

## 2012-01-31 DIAGNOSIS — I880 Nonspecific mesenteric lymphadenitis: Secondary | ICD-10-CM | POA: Diagnosis not present

## 2012-01-31 DIAGNOSIS — I728 Aneurysm of other specified arteries: Secondary | ICD-10-CM | POA: Insufficient documentation

## 2012-01-31 DIAGNOSIS — D49519 Neoplasm of unspecified behavior of unspecified kidney: Secondary | ICD-10-CM

## 2012-01-31 DIAGNOSIS — I714 Abdominal aortic aneurysm, without rupture: Secondary | ICD-10-CM | POA: Diagnosis not present

## 2012-01-31 MED ORDER — GADOBENATE DIMEGLUMINE 529 MG/ML IV SOLN
15.0000 mL | Freq: Once | INTRAVENOUS | Status: AC | PRN
  Administered 2012-01-31: 15 mL via INTRAVENOUS

## 2012-02-05 DIAGNOSIS — D4959 Neoplasm of unspecified behavior of other genitourinary organ: Secondary | ICD-10-CM | POA: Diagnosis not present

## 2012-02-05 DIAGNOSIS — N200 Calculus of kidney: Secondary | ICD-10-CM | POA: Diagnosis not present

## 2012-02-12 ENCOUNTER — Other Ambulatory Visit: Payer: Self-pay | Admitting: Urology

## 2012-02-12 DIAGNOSIS — N2889 Other specified disorders of kidney and ureter: Secondary | ICD-10-CM

## 2012-03-05 ENCOUNTER — Ambulatory Visit
Admission: RE | Admit: 2012-03-05 | Discharge: 2012-03-05 | Disposition: A | Discharge status: Home / Self Care | Payer: Medicare Other | Source: Ambulatory Visit | Attending: Urology | Admitting: Urology

## 2012-03-05 DIAGNOSIS — N2889 Other specified disorders of kidney and ureter: Secondary | ICD-10-CM

## 2012-03-05 DIAGNOSIS — N289 Disorder of kidney and ureter, unspecified: Secondary | ICD-10-CM | POA: Diagnosis not present

## 2012-03-09 NOTE — Progress Notes (Signed)
Patient ID: Louis Barnett, male   DOB: [birth year], 71 y.o.   MRN: [id number]  NEW PATIENT OFFICE VISIT  March 05, 2012  Louis Barnett, M.D. [facility] [address] [place], [place] [address]  RE: Louis Barnett (DOB: 1941/07/24)  Dear Louis Barnett:  Thank you for sending [name] for consultation regarding possible biopsy and/or ablation of a right renal lesion. The patient is a 71 year old male who is a regular patient of Louis Barnett and has a history of multiple interventions for bilateral renal calculi. Prior unenhanced CT scans at [facility] demonstrated small renal lesions. MRI of the abdomen was performed on 08/10/11 demonstrating an 11 mm right lateral interpolar lesion demonstrating complex signal intensity and probable low level enhancement. This was followed by another MRI study on 01/31/12 demonstrating stable size and imaging characteristics of the lesion. Actual accurate size of the lesion is 8 x 13 mm.  The patient has been asymptomatic with no flank pain, hematuria, dysuria or abnormal urinary habits. He has no prior history of malignancy. Previously imaging has demonstrated a calcified splenic artery aneurysm measuring approximately 12 mm which has been stable and is being followed by Louis Barnett.  Past Medical History:  1. Prior TIA in 2012. He has not had any other events and is on chronic Plavix and TriCor. 2. History of bilateral renal calculi. Most recent intervention was in December of 2012 with left ureteral stone extraction. 3. History of elevated triglycerides. The patient is followed by [facility] in [place]. 4. History of arthritis. 5. History of chronic renal insufficiency.  Medications: Plavix 75 mg daily, TriCor 48 mg daily.  Allergies: Penicillin.  Social History: The patient is married and has two children. He lives in [place], [place]. 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 He denies alcohol or tobacco use.  Family History; Father with history of heart disease and myocardial infarction. Other family members with history of diabetes and stroke.  Review of Systems: General: 5'8", 165 pounds. Vision: Normal vision. Gastrointestinal: No abdominal pain, nausea, vomiting or diarrhea. Cardiac: No chest pain or palpitations. Musculoskeletal: Chronic low back pain and joint pain. Neurologic: No current neurologic symptoms. Respiratory: No cough or shortness of breath.  Exam: Vital Signs: Blood pressure 140/71, pulse 68, respirations 15, temperature 98.1, oxygen saturation 99 percent on room air. General: No distress. Alert and oriented. Chest: Clear to auscultation bilaterally. Heart: Regular rate and rhythm. Abdomen: Soft and nontender. No flank tenderness. Extremities: No edema.  Labs: Creatinine 1.31 and estimated GFR 54 ml per minute on 01/31/12. WBC 4.6, hemoglobin 15.3, hematocrit 43.4, platelet count 138 on 07/18/11.  Imaging: I personally reviewed the MRI studies from January and July of this year and made measurements of the lesion of the right kidney. This is an ovoid cortical lesion measuring approximately 8 x 13 mm and either representing a complex cyst or a low grade neoplasm. When reviewing prior CT studies, the majority of the patient's prior studies have been unenhanced images and the lesion is vaguely hyperdense by CT, suggesting a hemorrhagic cyst. The patient did have a contrast enhanced study performed at [facility] dated 3/09 which clearly demonstrates the lesion on a venous phase after contrast administration with similar dimensions of roughly 12-13 mm in maximum diameter.  Assessment: I met with Louis Barnett and we reviewed all of the pertinent imaging studies. Although the lesion could certainly represent a very slow growing low grade  neoplasm, the fact that it was present on a 2009 CT scan and of similar size would  potentially argue for a greater likelihood that this represents a complex/hemorrhagic cyst. However, given low level enhancement by MRI, I told Louis Barnett that we should certainly continue to follow the lesion. Because the lesion is relatively stable, I did not recommend immediate biopsy or percutaneous ablation. I recommended that we perform another MRI in January to re-evaluate lesion size. If the lesion remains stable, we can likely watch it for growth over time.  Details of possible future percutaneous cryoablation and risks were discussed with the patient. The lesion is amenable to ablation based on size and location. The patient has a history of mild renal insufficiency and a nephron sparing procedure would be indicated.  Louis Barnett is in agreement with this plan. I will meet with him after follow-up MRI in January. Thank you again for sending Louis Barnett for consultation and allowing me to participate in his care.  Sincerely,  Louis Barnett, M.D.

## 2012-03-19 ENCOUNTER — Other Ambulatory Visit: Payer: Self-pay | Admitting: *Deleted

## 2012-03-19 DIAGNOSIS — I728 Aneurysm of other specified arteries: Secondary | ICD-10-CM

## 2012-06-16 DIAGNOSIS — N189 Chronic kidney disease, unspecified: Secondary | ICD-10-CM | POA: Diagnosis not present

## 2012-06-16 DIAGNOSIS — R319 Hematuria, unspecified: Secondary | ICD-10-CM | POA: Diagnosis not present

## 2012-06-16 DIAGNOSIS — Z0289 Encounter for other administrative examinations: Secondary | ICD-10-CM | POA: Diagnosis not present

## 2012-06-23 DIAGNOSIS — J069 Acute upper respiratory infection, unspecified: Secondary | ICD-10-CM | POA: Diagnosis not present

## 2012-06-23 DIAGNOSIS — H6120 Impacted cerumen, unspecified ear: Secondary | ICD-10-CM | POA: Diagnosis not present

## 2012-06-26 DIAGNOSIS — H6120 Impacted cerumen, unspecified ear: Secondary | ICD-10-CM | POA: Diagnosis not present

## 2012-06-26 DIAGNOSIS — E785 Hyperlipidemia, unspecified: Secondary | ICD-10-CM | POA: Diagnosis not present

## 2012-07-16 ENCOUNTER — Other Ambulatory Visit: Payer: Self-pay | Admitting: Interventional Radiology

## 2012-07-16 ENCOUNTER — Ambulatory Visit (INDEPENDENT_AMBULATORY_CARE_PROVIDER_SITE_OTHER): Payer: Medicare Other | Admitting: Otolaryngology

## 2012-07-16 DIAGNOSIS — H902 Conductive hearing loss, unspecified: Secondary | ICD-10-CM

## 2012-07-16 DIAGNOSIS — H6120 Impacted cerumen, unspecified ear: Secondary | ICD-10-CM

## 2012-07-16 DIAGNOSIS — N2889 Other specified disorders of kidney and ureter: Secondary | ICD-10-CM

## 2012-07-28 ENCOUNTER — Other Ambulatory Visit: Payer: Self-pay | Admitting: Interventional Radiology

## 2012-07-28 ENCOUNTER — Other Ambulatory Visit: Payer: Self-pay | Admitting: Radiology

## 2012-07-28 DIAGNOSIS — N2889 Other specified disorders of kidney and ureter: Secondary | ICD-10-CM

## 2012-08-12 ENCOUNTER — Ambulatory Visit (HOSPITAL_COMMUNITY)
Admission: RE | Admit: 2012-08-12 | Discharge: 2012-08-12 | Disposition: A | Discharge status: Home / Self Care | Payer: Medicare Other | Source: Ambulatory Visit | Attending: Interventional Radiology | Admitting: Interventional Radiology

## 2012-08-12 ENCOUNTER — Ambulatory Visit
Admission: RE | Admit: 2012-08-12 | Discharge: 2012-08-12 | Disposition: A | Discharge status: Home / Self Care | Payer: Medicare Other | Source: Ambulatory Visit | Attending: Interventional Radiology | Admitting: Interventional Radiology

## 2012-08-12 VITALS — BP 136/73 | HR 83 | Temp 98.3°F | Resp 16

## 2012-08-12 DIAGNOSIS — N2889 Other specified disorders of kidney and ureter: Secondary | ICD-10-CM

## 2012-08-12 DIAGNOSIS — N289 Disorder of kidney and ureter, unspecified: Secondary | ICD-10-CM | POA: Insufficient documentation

## 2012-08-12 DIAGNOSIS — N281 Cyst of kidney, acquired: Secondary | ICD-10-CM | POA: Diagnosis not present

## 2012-08-12 LAB — CREATININE, SERUM
GFR calc Af Amer: 55 mL/min — ABNORMAL LOW (ref >90)
GFR calc non Af Amer: 48 mL/min — ABNORMAL LOW (ref >90)

## 2012-08-12 MED ORDER — GADOBENATE DIMEGLUMINE 529 MG/ML IV SOLN
15.0000 mL | Freq: Once | INTRAVENOUS | Status: AC | PRN
  Administered 2012-08-12: 15 mL via INTRAVENOUS

## 2012-08-13 NOTE — Progress Notes (Signed)
PT HAVING SOME RT SIDED PAIN- NOT SURE IF FROM KIDNEY OR IF HE INJURED HIS BACK.  PT ALSO HAD A DOT PE ABOUT AGO AND DID EXPERIENCE SOME HEMATURIA. PT STILL ON PLAVIX O/W NO OTHER COMPLAINTS SINCE LAST VISIT.

## 2012-08-13 NOTE — Progress Notes (Signed)
Patient ID: Louis [facility]ett, male   DOB: [birth year], 72 y.o.   MRN: [id number]  ESTABLISHED PATIENT OFFICE VISIT  Chief Complaint: Surveillance of complex cystic lesion of the right kidney.  History: Louis Barnett returns for follow-up and was last seen 6 months ago after referral from Dr. Laverle Patter for possible ablation or biopsy of an 8 x 13 mm complex cystic lesion of the right kidney demonstrating potential mild enhancement by MRI. He returns for followup after MRI. The patient had some nausea with Gadolinium administration earlier today. He remains otherwise completely asymptomatic. He has had no significant interval medical issues.  Review of Systems: No abdominal pain, flank pain, hematuria, dysuria or fever.  Exam: Vital signs: Blood pressure 136/73, pulse 83, respirations 16, temperature 98.3, oxygen saturation 98% on room air. General: No acute distress. Abdomen: Soft and nontender. No flank tenderness. Skin: No hives or rash.  Labs: Creatinine 1.43 and estimated GFR 48 ml/minute earlier today.  Imaging: Follow-up MRI performed earlier today demonstrates a stable 8 x 13 mm hemorrhagic lesion of the lateral interpolar right kidney with continued suggestion of possible mild enhancement after administration of Gadolinium. This is favored to represent a hemorrhagic cyst by imaging and shows no interval growth. No other renal abnormalities.  Assessment and Plan: Stable right renal lesion which appears to represent a hemorrhagic cyst by imaging. I told the patient that this is likely benign and will likely not have to be treated or sampled in the future. However, I believe one additional follow-up MRI in 1 year would be reassuring after which time further routine follow-up imaging would not be necessary. The patient is agreeable. The patient did experience nausea with Gadolinium administration today which he has not experienced in the past with Gadolinium. There are no signs of this  representing a significant allergic reaction.

## 2012-08-31 ENCOUNTER — Encounter: Payer: Self-pay | Admitting: Vascular Surgery

## 2012-09-01 ENCOUNTER — Ambulatory Visit (INDEPENDENT_AMBULATORY_CARE_PROVIDER_SITE_OTHER): Payer: Medicare Other | Admitting: Vascular Surgery

## 2012-09-01 ENCOUNTER — Other Ambulatory Visit: Payer: Self-pay

## 2012-09-01 ENCOUNTER — Encounter: Payer: Self-pay | Admitting: Vascular Surgery

## 2012-09-01 ENCOUNTER — Ambulatory Visit
Admission: RE | Admit: 2012-09-01 | Discharge: 2012-09-01 | Disposition: A | Discharge status: Home / Self Care | Payer: Medicare Other | Source: Ambulatory Visit | Attending: Vascular Surgery | Admitting: Vascular Surgery

## 2012-09-01 VITALS — BP 138/89 | HR 91 | Resp 18 | Ht 68.0 in | Wt 169.0 lb

## 2012-09-01 DIAGNOSIS — I728 Aneurysm of other specified arteries: Secondary | ICD-10-CM

## 2012-09-01 DIAGNOSIS — I700 Atherosclerosis of aorta: Secondary | ICD-10-CM | POA: Diagnosis not present

## 2012-09-01 DIAGNOSIS — N200 Calculus of kidney: Secondary | ICD-10-CM | POA: Diagnosis not present

## 2012-09-01 MED ORDER — IOHEXOL 350 MG/ML SOLN
80.0000 mL | Freq: Once | INTRAVENOUS | Status: AC | PRN
  Administered 2012-09-01: 80 mL via INTRAVENOUS

## 2012-09-01 NOTE — Progress Notes (Signed)
The patient presents today for continued followup of incidental finding of splenic artery aneurysm. He does have continued ongoing evaluation of a renal mass as well with the recent MRI. He has no new medical problems. He does remain quite active.  Past Medical History  Diagnosis Date  . TIA (transient ischemic attack) 20002  . GERD (gastroesophageal reflux disease)   . High triglycerides   . Arthritis   . History of nephrolithiasis   . Ureteral stone     Left  . Microscopic hematuria   . Splenic artery aneurysm     History  Substance Use Topics  . Smoking status: Never Smoker   . Smokeless tobacco: Never Used  . Alcohol Use: No    Family History  Problem Relation Age of Onset  . Heart disease Father     heart attack  . Other Mother     varicose veins  . Diabetes Brother   . Heart disease Brother     heart attack    Allergies  Allergen Reactions  . Penicillins Rash    Current outpatient prescriptions:clopidogrel (PLAVIX) 75 MG tablet, Take 75 mg by mouth every morning.  , Disp: , Rfl: ;  fenofibrate (TRICOR) 48 MG tablet, Take 48 mg by mouth every morning. , Disp: , Rfl: ;  fish oil-omega-3 fatty acids 1000 MG capsule, Take 1 g by mouth daily.  , Disp: , Rfl:   BP 138/89  Pulse 91  Resp 18  Ht 5\' 8"  (1.727 m)  Wt 169 lb (76.658 kg)  BMI 25.70 kg/m2  Body mass index is 25.70 kg/(m^2).       Physical exam well-developed well-nourished white male no acute distress 2+ radial and femoral pulses bilaterally Abdomen soft nontender no masses noted Neurologically he is grossly intact  CT scan today with contrast does reveal a 1.2 cm splenic artery aneurysm near the splenic hilum. This is partially calcified. This is no change from a study one year ago.  Impression and plan: 1.2 cm asymptomatic splenic artery aneurysm. I again discussed symptoms of ruptured aneurysm with the patient and explained this would be quite unusual to the small size that he has. I have  recommended that we see him in 2 years for continued followup to rule out enlargement. We will repeat his CAT scan in 2 years

## 2012-11-02 DIAGNOSIS — Z6826 Body mass index (BMI) 26.0-26.9, adult: Secondary | ICD-10-CM | POA: Diagnosis not present

## 2012-11-02 DIAGNOSIS — E559 Vitamin D deficiency, unspecified: Secondary | ICD-10-CM | POA: Diagnosis not present

## 2012-11-02 DIAGNOSIS — E785 Hyperlipidemia, unspecified: Secondary | ICD-10-CM | POA: Diagnosis not present

## 2012-11-02 DIAGNOSIS — Z125 Encounter for screening for malignant neoplasm of prostate: Secondary | ICD-10-CM | POA: Diagnosis not present

## 2013-02-10 DIAGNOSIS — D4959 Neoplasm of unspecified behavior of other genitourinary organ: Secondary | ICD-10-CM | POA: Diagnosis not present

## 2013-02-10 DIAGNOSIS — N200 Calculus of kidney: Secondary | ICD-10-CM | POA: Diagnosis not present

## 2013-06-21 DIAGNOSIS — R197 Diarrhea, unspecified: Secondary | ICD-10-CM | POA: Diagnosis not present

## 2013-06-21 DIAGNOSIS — D696 Thrombocytopenia, unspecified: Secondary | ICD-10-CM | POA: Diagnosis not present

## 2013-06-21 DIAGNOSIS — Z79899 Other long term (current) drug therapy: Secondary | ICD-10-CM | POA: Diagnosis not present

## 2013-06-21 DIAGNOSIS — R7309 Other abnormal glucose: Secondary | ICD-10-CM | POA: Diagnosis not present

## 2013-06-21 DIAGNOSIS — Z6826 Body mass index (BMI) 26.0-26.9, adult: Secondary | ICD-10-CM | POA: Diagnosis not present

## 2013-06-21 DIAGNOSIS — J0100 Acute maxillary sinusitis, unspecified: Secondary | ICD-10-CM | POA: Diagnosis not present

## 2013-06-21 DIAGNOSIS — Z125 Encounter for screening for malignant neoplasm of prostate: Secondary | ICD-10-CM | POA: Diagnosis not present

## 2013-06-21 DIAGNOSIS — M159 Polyosteoarthritis, unspecified: Secondary | ICD-10-CM | POA: Diagnosis not present

## 2013-06-22 DIAGNOSIS — R197 Diarrhea, unspecified: Secondary | ICD-10-CM | POA: Diagnosis not present

## 2013-07-20 ENCOUNTER — Other Ambulatory Visit: Payer: Self-pay | Admitting: Emergency Medicine

## 2013-07-20 ENCOUNTER — Other Ambulatory Visit (HOSPITAL_COMMUNITY): Payer: Self-pay | Admitting: Interventional Radiology

## 2013-07-20 DIAGNOSIS — N2889 Other specified disorders of kidney and ureter: Secondary | ICD-10-CM

## 2013-07-20 DIAGNOSIS — N289 Disorder of kidney and ureter, unspecified: Secondary | ICD-10-CM

## 2013-08-11 DIAGNOSIS — L570 Actinic keratosis: Secondary | ICD-10-CM | POA: Diagnosis not present

## 2013-08-11 DIAGNOSIS — D692 Other nonthrombocytopenic purpura: Secondary | ICD-10-CM | POA: Diagnosis not present

## 2013-08-11 DIAGNOSIS — D485 Neoplasm of uncertain behavior of skin: Secondary | ICD-10-CM | POA: Diagnosis not present

## 2013-08-12 ENCOUNTER — Other Ambulatory Visit: Payer: Self-pay | Admitting: Radiology

## 2013-08-12 DIAGNOSIS — N2889 Other specified disorders of kidney and ureter: Secondary | ICD-10-CM

## 2013-08-18 DIAGNOSIS — N289 Disorder of kidney and ureter, unspecified: Secondary | ICD-10-CM | POA: Diagnosis not present

## 2013-08-19 LAB — CREATININE WITH EST GFR
CREATININE: 1.37 mg/dL — AB (ref 0.50–1.35)
GFR, Est African American: 59 mL/min — ABNORMAL LOW
GFR, Est Non African American: 51 mL/min — ABNORMAL LOW

## 2013-08-19 LAB — BUN: BUN: 22 mg/dL (ref 6–23)

## 2013-08-26 ENCOUNTER — Ambulatory Visit (HOSPITAL_COMMUNITY)
Admission: RE | Admit: 2013-08-26 | Discharge: 2013-08-26 | Disposition: A | Discharge status: Home / Self Care | Payer: Medicare Other | Source: Ambulatory Visit | Attending: Interventional Radiology | Admitting: Interventional Radiology

## 2013-08-26 ENCOUNTER — Ambulatory Visit
Admission: RE | Admit: 2013-08-26 | Discharge: 2013-08-26 | Disposition: A | Discharge status: Home / Self Care | Payer: Medicare Other | Source: Ambulatory Visit | Attending: Interventional Radiology | Admitting: Interventional Radiology

## 2013-08-26 ENCOUNTER — Other Ambulatory Visit (HOSPITAL_COMMUNITY): Payer: Self-pay | Admitting: Interventional Radiology

## 2013-08-26 DIAGNOSIS — N2889 Other specified disorders of kidney and ureter: Secondary | ICD-10-CM | POA: Insufficient documentation

## 2013-08-26 DIAGNOSIS — N289 Disorder of kidney and ureter, unspecified: Secondary | ICD-10-CM | POA: Diagnosis not present

## 2013-08-26 DIAGNOSIS — K7689 Other specified diseases of liver: Secondary | ICD-10-CM | POA: Diagnosis not present

## 2013-08-26 DIAGNOSIS — N281 Cyst of kidney, acquired: Secondary | ICD-10-CM | POA: Insufficient documentation

## 2013-09-02 DIAGNOSIS — D485 Neoplasm of uncertain behavior of skin: Secondary | ICD-10-CM | POA: Diagnosis not present

## 2013-09-30 DIAGNOSIS — M159 Polyosteoarthritis, unspecified: Secondary | ICD-10-CM | POA: Diagnosis not present

## 2013-09-30 DIAGNOSIS — Z6825 Body mass index (BMI) 25.0-25.9, adult: Secondary | ICD-10-CM | POA: Diagnosis not present

## 2013-09-30 DIAGNOSIS — Z0000 Encounter for general adult medical examination without abnormal findings: Secondary | ICD-10-CM | POA: Diagnosis not present

## 2013-09-30 DIAGNOSIS — N183 Chronic kidney disease, stage 3 unspecified: Secondary | ICD-10-CM | POA: Diagnosis not present

## 2013-11-02 ENCOUNTER — Other Ambulatory Visit (HOSPITAL_COMMUNITY): Payer: Self-pay | Admitting: Family Medicine

## 2013-11-02 DIAGNOSIS — Z6825 Body mass index (BMI) 25.0-25.9, adult: Secondary | ICD-10-CM | POA: Diagnosis not present

## 2013-11-02 DIAGNOSIS — R599 Enlarged lymph nodes, unspecified: Secondary | ICD-10-CM

## 2013-11-02 DIAGNOSIS — R319 Hematuria, unspecified: Secondary | ICD-10-CM

## 2013-11-02 DIAGNOSIS — Z125 Encounter for screening for malignant neoplasm of prostate: Secondary | ICD-10-CM | POA: Diagnosis not present

## 2013-11-05 ENCOUNTER — Ambulatory Visit (HOSPITAL_COMMUNITY)
Admission: RE | Admit: 2013-11-05 | Discharge: 2013-11-05 | Disposition: A | Discharge status: Home / Self Care | Payer: Medicare Other | Source: Ambulatory Visit | Attending: Vascular Surgery | Admitting: Vascular Surgery

## 2013-11-05 ENCOUNTER — Other Ambulatory Visit: Payer: Self-pay | Admitting: Vascular Surgery

## 2013-11-05 ENCOUNTER — Other Ambulatory Visit (HOSPITAL_COMMUNITY): Payer: Self-pay | Admitting: Family Medicine

## 2013-11-05 ENCOUNTER — Ambulatory Visit (HOSPITAL_COMMUNITY)
Admission: RE | Admit: 2013-11-05 | Discharge: 2013-11-05 | Disposition: A | Discharge status: Home / Self Care | Payer: Medicare Other | Source: Ambulatory Visit | Attending: Family Medicine | Admitting: Family Medicine

## 2013-11-05 DIAGNOSIS — T8140XA Infection following a procedure, unspecified, initial encounter: Secondary | ICD-10-CM | POA: Diagnosis not present

## 2013-11-05 DIAGNOSIS — R599 Enlarged lymph nodes, unspecified: Secondary | ICD-10-CM

## 2013-11-05 DIAGNOSIS — N508 Other specified disorders of male genital organs: Secondary | ICD-10-CM | POA: Diagnosis not present

## 2013-11-05 DIAGNOSIS — R9389 Abnormal findings on diagnostic imaging of other specified body structures: Secondary | ICD-10-CM | POA: Insufficient documentation

## 2013-11-05 DIAGNOSIS — R319 Hematuria, unspecified: Secondary | ICD-10-CM

## 2013-11-05 DIAGNOSIS — I728 Aneurysm of other specified arteries: Secondary | ICD-10-CM

## 2014-03-07 DIAGNOSIS — L570 Actinic keratosis: Secondary | ICD-10-CM | POA: Diagnosis not present

## 2014-06-16 DIAGNOSIS — H9190 Unspecified hearing loss, unspecified ear: Secondary | ICD-10-CM | POA: Diagnosis not present

## 2014-06-16 DIAGNOSIS — H6120 Impacted cerumen, unspecified ear: Secondary | ICD-10-CM | POA: Diagnosis not present

## 2014-07-04 ENCOUNTER — Other Ambulatory Visit: Payer: Self-pay | Admitting: Urology

## 2014-07-04 DIAGNOSIS — D49519 Neoplasm of unspecified behavior of unspecified kidney: Secondary | ICD-10-CM

## 2014-07-26 ENCOUNTER — Ambulatory Visit (HOSPITAL_COMMUNITY)
Admission: RE | Admit: 2014-07-26 | Discharge: 2014-07-26 | Disposition: A | Discharge status: Home / Self Care | Payer: Medicare Other | Source: Ambulatory Visit | Attending: Urology | Admitting: Urology

## 2014-07-26 ENCOUNTER — Other Ambulatory Visit: Payer: Self-pay | Admitting: Urology

## 2014-07-26 DIAGNOSIS — N289 Disorder of kidney and ureter, unspecified: Secondary | ICD-10-CM | POA: Diagnosis not present

## 2014-07-26 DIAGNOSIS — D49519 Neoplasm of unspecified behavior of unspecified kidney: Secondary | ICD-10-CM

## 2014-07-26 DIAGNOSIS — N281 Cyst of kidney, acquired: Secondary | ICD-10-CM | POA: Diagnosis not present

## 2014-07-26 DIAGNOSIS — Z09 Encounter for follow-up examination after completed treatment for conditions other than malignant neoplasm: Secondary | ICD-10-CM | POA: Diagnosis not present

## 2014-07-26 DIAGNOSIS — N2889 Other specified disorders of kidney and ureter: Secondary | ICD-10-CM | POA: Diagnosis not present

## 2014-08-17 DIAGNOSIS — D225 Melanocytic nevi of trunk: Secondary | ICD-10-CM | POA: Diagnosis not present

## 2014-08-17 DIAGNOSIS — D485 Neoplasm of uncertain behavior of skin: Secondary | ICD-10-CM | POA: Diagnosis not present

## 2014-08-17 DIAGNOSIS — D692 Other nonthrombocytopenic purpura: Secondary | ICD-10-CM | POA: Diagnosis not present

## 2014-08-17 DIAGNOSIS — L570 Actinic keratosis: Secondary | ICD-10-CM | POA: Diagnosis not present

## 2014-09-05 ENCOUNTER — Other Ambulatory Visit: Payer: Self-pay | Admitting: Vascular Surgery

## 2014-09-05 ENCOUNTER — Encounter: Payer: Self-pay | Admitting: Vascular Surgery

## 2014-09-05 DIAGNOSIS — I728 Aneurysm of other specified arteries: Secondary | ICD-10-CM | POA: Diagnosis not present

## 2014-09-05 LAB — BUN: BUN: 21 mg/dL (ref 6–23)

## 2014-09-05 LAB — CREATININE, SERUM: Creat: 1.3 mg/dL (ref 0.50–1.35)

## 2014-09-06 ENCOUNTER — Ambulatory Visit (INDEPENDENT_AMBULATORY_CARE_PROVIDER_SITE_OTHER): Payer: Medicare Other | Admitting: Vascular Surgery

## 2014-09-06 ENCOUNTER — Encounter: Payer: Self-pay | Admitting: Vascular Surgery

## 2014-09-06 ENCOUNTER — Ambulatory Visit
Admission: RE | Admit: 2014-09-06 | Discharge: 2014-09-06 | Disposition: A | Discharge status: Home / Self Care | Payer: Medicare Other | Source: Ambulatory Visit | Attending: Vascular Surgery | Admitting: Vascular Surgery

## 2014-09-06 VITALS — BP 143/68 | HR 78 | Resp 18 | Ht 67.5 in | Wt 169.9 lb

## 2014-09-06 DIAGNOSIS — I728 Aneurysm of other specified arteries: Secondary | ICD-10-CM

## 2014-09-06 DIAGNOSIS — N200 Calculus of kidney: Secondary | ICD-10-CM | POA: Diagnosis not present

## 2014-09-06 MED ORDER — IOHEXOL 350 MG/ML SOLN
75.0000 mL | Freq: Once | INTRAVENOUS | Status: AC | PRN
  Administered 2014-09-06: 75 mL via INTRAVENOUS

## 2014-09-06 NOTE — Progress Notes (Addendum)
Brief History and Physical  History of Present Illness  Louis Barnett is a 74 y.o. male who presents with chief complaint: follow up for known splenic artery aneursym.  The patient presents today for review of a CTA.  He reports no change in his health in the past 2 years since his last visit.  He has no history of SOB, CP, weakness on one side of his body.  No N/V/D.  He does report urinary frequency.  Other wise negative reviw of systems.   Past medical history includes: TIA, GERD and high triglycerides.  He takes Plavix daily , tricor and fish oil.     Past Medical History  Diagnosis Date  . TIA (transient ischemic attack) 20002  . GERD (gastroesophageal reflux disease)   . High triglycerides   . Arthritis   . History of nephrolithiasis   . Ureteral stone     Left  . Microscopic hematuria   . Splenic artery aneurysm     Past Surgical History  Procedure Laterality Date  . Cystoscopy  2007  . Cystoscopy with ureteroscopy  07/18/2011    Procedure: CYSTOSCOPY WITH URETEROSCOPY;  Surgeon: Malka So;  Location: [facility];  Service: Urology;  Laterality: N/A;    History   Social History  . Marital Status: Married    Spouse Name: N/A    Number of Children: N/A  . Years of Education: N/A   Occupational History  . Not on file.   Social History Main Topics  . Smoking status: Never Smoker   . Smokeless tobacco: Never Used  . Alcohol Use: No  . Drug Use: No  . Sexual Activity: Not on file   Other Topics Concern  . Not on file   Social History Narrative    Family History  Problem Relation Age of Onset  . Heart disease Father     heart attack  . Other Mother     varicose veins  . Diabetes Brother   . Heart disease Brother     heart attack    Current Outpatient Prescriptions on File Prior to Visit  Medication Sig Dispense Refill  . clopidogrel (PLAVIX) 75 MG tablet Take 75 mg by mouth every morning.      . fenofibrate (TRICOR) 48 MG tablet Take 48 mg by mouth  every morning.     . fish oil-omega-3 fatty acids 1000 MG capsule Take 1 g by mouth daily.       No current facility-administered medications on file prior to visit.    Allergies  Allergen Reactions  . Penicillins Rash    Review of Systems: As listed above, otherwise negative.  Physical Examination  Filed Vitals:   09/06/14 1304  BP: 143/68  Pulse: 78  Resp: 18  Height: 5' 7.5" (1.715 m)  Weight: 169 lb 14.4 oz (77.066 kg)    General: A&O x 3, WDWN  Pulmonary: Sym exp, good air movt, CTAB, no rales, rhonchi, & wheezing  Cardiac: RRR, Nl S1, S2, no Murmurs, rubs or gallops  Gastrointestinal: soft, NTND, -G/R, - HSM, - masses  Musculoskeletal: M/S 5/5 throughout , Extremities without ischemic changes   Vascular: Palpable radial brachial, femoral and DP pulses 3+  No carotid bruits  CTA from 09/01/2012 1.2 cm asymptomatic splenic artery aneurysm.  CTA 09/06/2014  1.3 cm asymptomatic splenic artery aneurysm.   Medical Decision Making  Louis Barnett is a 74 y.o. male who presents with: 1.3 cmasymptomatic splenic artery aneurysm.  The CTA was reviewed by Dr. Donnetta Hutching today.  He has had no significant change is the size of the aneurysm.  We will continue to watch it by following serial CTA's every 2 years at this point.  If he had sudden sever left sided flank pain he was instructed to go to the ED.  There is very low risk for this event.    He was seen in conjunction with Dr. Donnetta Hutching today in clinic.   Louis [facility]ett [facility] PA-C [facility] Office: [phone]   09/06/2014, 1:25 PM  I have examined the patient, reviewed and agree with above.  EARLY, TODD, MD 09/06/2014 1:57 PM

## 2014-09-07 DIAGNOSIS — N200 Calculus of kidney: Secondary | ICD-10-CM | POA: Diagnosis not present

## 2014-09-08 DIAGNOSIS — L988 Other specified disorders of the skin and subcutaneous tissue: Secondary | ICD-10-CM | POA: Diagnosis not present

## 2014-09-08 DIAGNOSIS — D485 Neoplasm of uncertain behavior of skin: Secondary | ICD-10-CM | POA: Diagnosis not present

## 2014-09-21 DIAGNOSIS — Z4802 Encounter for removal of sutures: Secondary | ICD-10-CM | POA: Diagnosis not present

## 2014-10-10 DIAGNOSIS — R05 Cough: Secondary | ICD-10-CM | POA: Diagnosis not present

## 2014-10-10 DIAGNOSIS — J029 Acute pharyngitis, unspecified: Secondary | ICD-10-CM | POA: Diagnosis not present

## 2014-12-22 DIAGNOSIS — Z79899 Other long term (current) drug therapy: Secondary | ICD-10-CM | POA: Diagnosis not present

## 2014-12-22 DIAGNOSIS — Z0000 Encounter for general adult medical examination without abnormal findings: Secondary | ICD-10-CM | POA: Diagnosis not present

## 2014-12-22 DIAGNOSIS — Z6825 Body mass index (BMI) 25.0-25.9, adult: Secondary | ICD-10-CM | POA: Diagnosis not present

## 2014-12-22 DIAGNOSIS — E782 Mixed hyperlipidemia: Secondary | ICD-10-CM | POA: Diagnosis not present

## 2014-12-22 DIAGNOSIS — M1991 Primary osteoarthritis, unspecified site: Secondary | ICD-10-CM | POA: Diagnosis not present

## 2014-12-22 DIAGNOSIS — E663 Overweight: Secondary | ICD-10-CM | POA: Diagnosis not present

## 2014-12-22 DIAGNOSIS — Z125 Encounter for screening for malignant neoplasm of prostate: Secondary | ICD-10-CM | POA: Diagnosis not present

## 2015-07-25 DIAGNOSIS — J0100 Acute maxillary sinusitis, unspecified: Secondary | ICD-10-CM | POA: Diagnosis not present

## 2015-07-25 DIAGNOSIS — R05 Cough: Secondary | ICD-10-CM | POA: Diagnosis not present

## 2015-07-25 DIAGNOSIS — J40 Bronchitis, not specified as acute or chronic: Secondary | ICD-10-CM | POA: Diagnosis not present

## 2015-08-03 DIAGNOSIS — H9193 Unspecified hearing loss, bilateral: Secondary | ICD-10-CM | POA: Diagnosis not present

## 2015-08-03 DIAGNOSIS — E663 Overweight: Secondary | ICD-10-CM | POA: Diagnosis not present

## 2015-08-03 DIAGNOSIS — Z6825 Body mass index (BMI) 25.0-25.9, adult: Secondary | ICD-10-CM | POA: Diagnosis not present

## 2015-08-03 DIAGNOSIS — Z1389 Encounter for screening for other disorder: Secondary | ICD-10-CM | POA: Diagnosis not present

## 2015-08-15 ENCOUNTER — Telehealth: Payer: Self-pay

## 2015-08-15 ENCOUNTER — Other Ambulatory Visit (HOSPITAL_COMMUNITY): Payer: Self-pay | Admitting: Interventional Radiology

## 2015-08-15 DIAGNOSIS — N289 Disorder of kidney and ureter, unspecified: Secondary | ICD-10-CM

## 2015-08-15 NOTE — Telephone Encounter (Signed)
Pt was calling to give DS insurance info to schedule his procedure, He is aware that she is out of the office until next week and he will call back

## 2015-08-23 NOTE — Telephone Encounter (Signed)
LMOM to call.

## 2015-08-24 NOTE — Telephone Encounter (Signed)
PT is scheduled for OV with Walden Field, NP on 09/04/2015 at 9:00 AM. His last colonoscopy was 08/12/2006 by Dr. Arnoldo Morale ( copy is with his OV info). He had a tubular adenoma and was supposed to have had next colonoscopy in 3 years.

## 2015-09-04 ENCOUNTER — Ambulatory Visit (INDEPENDENT_AMBULATORY_CARE_PROVIDER_SITE_OTHER): Payer: Medicare Other | Admitting: Nurse Practitioner

## 2015-09-04 ENCOUNTER — Encounter: Payer: Self-pay | Admitting: Nurse Practitioner

## 2015-09-04 VITALS — BP 135/69 | HR 65 | Temp 97.6°F | Ht 68.0 in | Wt 165.4 lb

## 2015-09-04 DIAGNOSIS — Z86010 Personal history of colonic polyps: Secondary | ICD-10-CM | POA: Insufficient documentation

## 2015-09-04 NOTE — Progress Notes (Signed)
Primary Care Physician:  Collene Mares, PA-C Primary Gastroenterologist:  Dr. Oneida Alar  Chief Complaint  Patient presents with  . Colonoscopy    HPI:   75 year old male presents for repeat surveillance colonoscopy. He was brought in for office visit versus phone triage due to Plavix use and history of tubular adenoma. Last colonoscopy completed 08/12/2006 with Dr. Arnoldo Morale in surgery which found a single sessile polyp in the rectum, mild diverticulosis in the sigmoid colon. 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 Recommended repeat colonoscopy in 3 years.  Today he states he is feeling well overall. Has occasional diarrhea which is baseline for him, maybe a little more over the past couple years. Denies abdominal pain, N/V, change in bowel habits, fever, chills, hematochezia, melena, unintentional weight loss. Denies chest pain, dyspnea, dizziness, lightheadedness, syncope, near syncope. Denies any other upper or lower GI symptoms.  States family members have had cancer but not sure which type.  Past Medical History  Diagnosis Date  . TIA (transient ischemic attack) 20002  . GERD (gastroesophageal reflux disease)   . High triglycerides   . Arthritis   . History of nephrolithiasis   . Ureteral stone     Left  . Microscopic hematuria   . Splenic artery aneurysm [facility])     Past Surgical History  Procedure Laterality Date  . Cystoscopy  2007  . Cystoscopy with ureteroscopy  07/18/2011  . Colonoscopy  08/12/2006    [facility];mild divertlculosis in colon/sessile polyps in the rectum    Current Outpatient Prescriptions  Medication Sig Dispense Refill  . clopidogrel (PLAVIX) 75 MG tablet Take 75 mg by mouth every morning.      . fenofibrate (TRICOR) 48 MG tablet Take 48 mg by mouth every morning.     . fish oil-omega-3 fatty acids 1000 MG capsule Take 1 g by mouth daily. Reported on  09/04/2015     No current facility-administered medications for this visit.    Allergies as of 09/04/2015 - Review Complete 09/04/2015  Allergen Reaction Noted  . Penicillins Rash 07/15/2011    Family History  Problem Relation Age of Onset  . Heart disease Father     heart attack  . Other Mother     varicose veins  . Diabetes Brother   . Heart disease Brother     heart attack    Social History   Social History  . Marital Status: Married    Spouse Name: N/A  . Number of Children: N/A  . Years of Education: N/A   Occupational History  . Not on file.   Social History Main Topics  . Smoking status: Never Smoker   . Smokeless tobacco: Never Used  . Alcohol Use: No  . Drug Use: No  . Sexual Activity: Not on file   Other Topics Concern  . Not on file   Social History Narrative    Review of Systems: 10-point ROS negative except as per HPI.    Physical Exam: BP 135/69 mmHg  Pulse 65  Temp(Src) 97.6 F (36.4 C) (Oral)  Ht 5\' 8"  (1.727 m)  Wt 165 lb 6.4 oz (75.025 kg)  BMI 25.15 kg/m2 General:   Alert and oriented. Pleasant and cooperative. Well-nourished and well-developed.  Head:  Normocephalic and atraumatic. Eyes:  Without icterus, sclera clear and conjunctiva pink.  Ears:  Normal auditory acuity. Cardiovascular:  S1, S2 present without murmurs appreciated. Extremities without clubbing or  edema. Respiratory:  Clear to auscultation bilaterally. No wheezes, rales, or rhonchi. No distress.  Gastrointestinal:  +BS, soft, non-tender and non-distended. No HSM noted. No guarding or rebound. No masses appreciated.  Rectal:  Deferred  Musculoskalatal:  Symmetrical without gross deformities. Normal posture. Neurologic:  Alert and oriented x4;  grossly normal neurologically. Psych:  Alert and cooperative. Normal mood and affect. Heme/Lymph/Immune: No excessive bruising noted.    09/04/2015 9:31 AM

## 2015-09-04 NOTE — Assessment & Plan Note (Signed)
75 year old male with a surveillance colonoscopy approximately 9 years ago which had a single sessile polyp in the rectum found to be tubular adenoma. Recommended 3 year repeat which was not completed. He presents today for follow-up. We will proceed with previous he recommended colonoscopy.  Proceed with colonoscopy with Dr. Oneida Alar in the near future. The risks, benefits, and alternatives have been discussed in detail with the patient. They state understanding and desire to proceed.   Patient is not on any anxiolytics, chronic pain medicines, or antidepressants. Denies alcohol and drug use.  He does take daily Plavix and TriCor for history of TIA. At this point we'll proceed with a colonoscopy without stopping Plavix due to TIA history and risk.

## 2015-09-04 NOTE — Progress Notes (Signed)
cc'ed to pcp °

## 2015-09-04 NOTE — Patient Instructions (Signed)
1. We will schedule your procedure for you. 2. Further recommendations to be based on results of your procedure. 3. Return for follow-up as needed based on recommendations or for any stomach or colon problems.

## 2015-09-05 ENCOUNTER — Other Ambulatory Visit: Payer: Self-pay

## 2015-09-05 ENCOUNTER — Ambulatory Visit
Admission: RE | Admit: 2015-09-05 | Discharge: 2015-09-05 | Disposition: A | Discharge status: Home / Self Care | Payer: Medicare Other | Source: Ambulatory Visit | Attending: Interventional Radiology | Admitting: Interventional Radiology

## 2015-09-05 ENCOUNTER — Ambulatory Visit (HOSPITAL_COMMUNITY)
Admission: RE | Admit: 2015-09-05 | Discharge: 2015-09-05 | Disposition: A | Discharge status: Home / Self Care | Payer: Medicare Other | Source: Ambulatory Visit | Attending: Interventional Radiology | Admitting: Interventional Radiology

## 2015-09-05 DIAGNOSIS — N289 Disorder of kidney and ureter, unspecified: Secondary | ICD-10-CM

## 2015-09-05 DIAGNOSIS — N2889 Other specified disorders of kidney and ureter: Secondary | ICD-10-CM | POA: Diagnosis not present

## 2015-09-05 DIAGNOSIS — Z86010 Personal history of colonic polyps: Secondary | ICD-10-CM

## 2015-09-05 DIAGNOSIS — N281 Cyst of kidney, acquired: Secondary | ICD-10-CM | POA: Diagnosis not present

## 2015-09-05 MED ORDER — PEG 3350-KCL-NA BICARB-NACL 420 G PO SOLR: 4000.0000 mL | Freq: Once | ORAL | Status: DC

## 2015-09-18 ENCOUNTER — Encounter (HOSPITAL_COMMUNITY): Payer: Self-pay | Admitting: *Deleted

## 2015-09-18 ENCOUNTER — Ambulatory Visit (HOSPITAL_COMMUNITY)
Admission: RE | Admit: 2015-09-18 | Discharge: 2015-09-18 | Disposition: A | Discharge status: Home / Self Care | Payer: Medicare Other | Source: Ambulatory Visit | Attending: Gastroenterology | Admitting: Gastroenterology

## 2015-09-18 ENCOUNTER — Encounter (HOSPITAL_COMMUNITY)
Admission: RE | Disposition: A | Discharge status: Home / Self Care | Payer: Self-pay | Source: Ambulatory Visit | Attending: Gastroenterology

## 2015-09-18 DIAGNOSIS — D122 Benign neoplasm of ascending colon: Secondary | ICD-10-CM

## 2015-09-18 DIAGNOSIS — K648 Other hemorrhoids: Secondary | ICD-10-CM | POA: Diagnosis not present

## 2015-09-18 DIAGNOSIS — Z86010 Personal history of colonic polyps: Secondary | ICD-10-CM

## 2015-09-18 DIAGNOSIS — K621 Rectal polyp: Secondary | ICD-10-CM | POA: Diagnosis not present

## 2015-09-18 DIAGNOSIS — Z7902 Long term (current) use of antithrombotics/antiplatelets: Secondary | ICD-10-CM | POA: Diagnosis not present

## 2015-09-18 DIAGNOSIS — Z8673 Personal history of transient ischemic attack (TIA), and cerebral infarction without residual deficits: Secondary | ICD-10-CM | POA: Insufficient documentation

## 2015-09-18 DIAGNOSIS — D123 Benign neoplasm of transverse colon: Secondary | ICD-10-CM | POA: Diagnosis not present

## 2015-09-18 DIAGNOSIS — D128 Benign neoplasm of rectum: Secondary | ICD-10-CM | POA: Diagnosis not present

## 2015-09-18 DIAGNOSIS — K219 Gastro-esophageal reflux disease without esophagitis: Secondary | ICD-10-CM | POA: Insufficient documentation

## 2015-09-18 DIAGNOSIS — M1991 Primary osteoarthritis, unspecified site: Secondary | ICD-10-CM | POA: Diagnosis not present

## 2015-09-18 DIAGNOSIS — Z79899 Other long term (current) drug therapy: Secondary | ICD-10-CM | POA: Diagnosis not present

## 2015-09-18 DIAGNOSIS — K644 Residual hemorrhoidal skin tags: Secondary | ICD-10-CM | POA: Insufficient documentation

## 2015-09-18 DIAGNOSIS — Z1211 Encounter for screening for malignant neoplasm of colon: Secondary | ICD-10-CM | POA: Diagnosis not present

## 2015-09-18 HISTORY — PX: COLONOSCOPY: SHX5424

## 2015-09-18 SURGERY — COLONOSCOPY
Anesthesia: Moderate Sedation

## 2015-09-18 MED ORDER — SODIUM CHLORIDE 0.9 % IV SOLN
INTRAVENOUS | Status: DC
  Administered 2015-09-18: 13:00:00 via INTRAVENOUS

## 2015-09-18 MED ORDER — MEPERIDINE HCL 100 MG/ML IJ SOLN
INTRAMUSCULAR | Status: DC | PRN
  Administered 2015-09-18 (×3): 25 mg

## 2015-09-18 MED ORDER — MIDAZOLAM HCL 5 MG/5ML IJ SOLN
INTRAMUSCULAR | Status: AC
  Filled 2015-09-18: qty 10

## 2015-09-18 MED ORDER — STERILE WATER FOR IRRIGATION IR SOLN
Status: DC | PRN
  Administered 2015-09-18: 13:00:00

## 2015-09-18 MED ORDER — MIDAZOLAM HCL 5 MG/5ML IJ SOLN
INTRAMUSCULAR | Status: DC | PRN
  Administered 2015-09-18: 1 mg via INTRAVENOUS
  Administered 2015-09-18 (×2): 2 mg via INTRAVENOUS

## 2015-09-18 MED ORDER — MEPERIDINE HCL 100 MG/ML IJ SOLN
INTRAMUSCULAR | Status: AC
  Filled 2015-09-18: qty 2

## 2015-09-18 NOTE — Op Note (Addendum)
[facility] [address] [place], [address]   COLONOSCOPY PROCEDURE REPORT  PATIENT: Louis Barnett, Louis Barnett  MR#: [id number] BIRTHDATE: 1941-07-06 , 74  yrs. old GENDER: male ENDOSCOPIST: Danie Binder, MD REFERRED [id number] Mann, PA-C PROCEDURE DATE:  09/23/2015 PROCEDURE:   Colonoscopy with cold biopsy polypectomy and Colonoscopy with snare polypectomy INDICATIONS:high risk patient with personal history of colonic polyps. MEDICATIONS: Demerol 75 mg IV and Versed 5 mg IV  DESCRIPTION OF PROCEDURE:    Physical exam was performed.  Informed consent was obtained from the patient after explaining the benefits, risks, and alternatives to procedure.  The patient was connected to monitor and placed in left lateral position. Continuous oxygen was provided by nasal cannula and IV medicine administered through an indwelling cannula.  After administration of sedation and rectal exam, the patients rectum was intubated and the [id number]  colonoscope was advanced under direct visualization to the cecum.  The scope was removed slowly by carefully examining the color, texture, anatomy, and integrity mucosa on the way out.  The patient was recovered in endoscopy and discharged home in satisfactory condition. Estimated blood loss is zero unless otherwise noted in this procedure report.    COLON FINDINGS: A sessile polyp measuring 6 mm in size was found in the ascending colon.  A polypectomy was performed using snare cautery.  , Two sessile polyps ranging from 2 to 27mm in size were found in the rectum.  A polypectomy was performed with cold forceps.  , Moderate sized internal hemorrhoids were found.  , and Large external hemorrhoids were found.  PREP QUALITY: good.   CECAL W/D TIME: 15       minutes COMPLICATIONS: None  ENDOSCOPIC IMPRESSION: 1.   THREE COLORECTAL POLYPS REMOVED 2.   Moderate sized internal hemorrhoids 3.   Large external  hemorrhoids  RECOMMENDATIONS: FOLLOW A HIGH FIBER DIET. AWAIT BIOPSY RESULTS. Next colonoscopy in 5-10 years.     _______________________________ eSignedDanie Binder, MD 09/23/2015 8:31 PM    CPT CODES: ICD CODES:  The ICD and CPT codes recommended by this software are interpretations from the data that the clinical staff has captured with the software.  The verification of the translation of this report to the ICD and CPT codes and modifiers is the sole responsibility of the health care institution and practicing physician where this report was generated.  [employer]. will not be held responsible for the validity of the ICD and CPT codes included on this report.  AMA assumes no liability for data contained or not contained herein. CPT is a Designer, television/film set of the [employer].

## 2015-09-18 NOTE — Progress Notes (Signed)
REVIEWED-NO ADDITIONAL RECOMMENDATIONS. 

## 2015-09-18 NOTE — Discharge Instructions (Signed)
You had 3 polyps removed. You have small internal hemorrhoids and diverticulosis IN YOUR RIGHT AND LEFT COLON.   FOLLOW A HIGH FIBER DIET. AVOID ITEMS THAT CAUSE BLOATING. SEE INFO BELOW.  YOUR BIOPSY RESULTS WILL BE AVAILABLE IN MY CHART FEB 23 AND MY OFFICE WILL CONTACT YOU IN 10-14 DAYS WITH YOUR RESULTS.   Next colonoscopy in 5-10 years.   Colonoscopy Care After Read the instructions outlined below and refer to this sheet in the next week. These discharge instructions provide you with general information on caring for yourself after you leave the hospital. While your treatment has been planned according to the most current medical practices available, unavoidable complications occasionally occur. If you have any problems or questions after discharge, call DR. Cyntia Staley, [phone].  ACTIVITY  You may resume your regular activity, but move at a slower pace for the next 24 hours.   Take frequent rest periods for the next 24 hours.   Walking will help get rid of the air and reduce the bloated feeling in your belly (abdomen).   No driving for 24 hours (because of the medicine (anesthesia) used during the test).   You may shower.   Do not sign any important legal documents or operate any machinery for 24 hours (because of the anesthesia used during the test).    NUTRITION  Drink plenty of fluids.   You may resume your normal diet as instructed by your doctor.   Begin with a light meal and progress to your normal diet. Heavy or fried foods are harder to digest and may make you feel sick to your stomach (nauseated).   Avoid alcoholic beverages for 24 hours or as instructed.    MEDICATIONS  You may resume your normal medications.   WHAT YOU CAN EXPECT TODAY  Some feelings of bloating in the abdomen.   Passage of more gas than usual.   Spotting of blood in your stool or on the toilet paper  .  IF YOU HAD POLYPS REMOVED DURING THE COLONOSCOPY:  Eat a soft diet IF YOU  HAVE NAUSEA, BLOATING, ABDOMINAL PAIN, OR VOMITING.    FINDING OUT THE RESULTS OF YOUR TEST Not all test results are available during your visit. DR. Oneida Alar WILL CALL YOU WITHIN 14 DAYS OF YOUR PROCEDUE WITH YOUR RESULTS. Do not assume everything is normal if you have not heard from DR. Cortez Flippen, CALL HER OFFICE AT [phone].  SEEK IMMEDIATE MEDICAL ATTENTION AND CALL THE OFFICE: [phone] IF:  You have more than a spotting of blood in your stool.   Your belly is swollen (abdominal distention).   You are nauseated or vomiting.   You have a temperature over 101F.   You have abdominal pain or discomfort that is severe or gets worse throughout the day.  Polyps, Colon  A polyp is extra tissue that grows inside your body. Colon polyps grow in the large intestine. The large intestine, also called the colon, is part of your digestive system. It is a long, hollow tube at the end of your digestive tract where your body makes and stores stool. Most polyps are not dangerous. They are benign. This means they are not cancerous. But over time, some types of polyps can turn into cancer. Polyps that are smaller than a pea are usually not harmful. But larger polyps could someday become or may already be cancerous. To be safe, doctors remove all polyps and test them.   PREVENTION There is not one sure way to prevent  polyps. You might be able to lower your risk of getting them if you:  Eat more fruits and vegetables and less fatty food.   Do not smoke.   Avoid alcohol.   Exercise every day.   Lose weight if you are overweight.   Eating more calcium and folate can also lower your risk of getting polyps. Some foods that are rich in calcium are milk, cheese, and broccoli. Some foods that are rich in folate are chickpeas, kidney beans, and spinach.   High-Fiber Diet A high-fiber diet changes your normal diet to include more whole grains, legumes, fruits, and vegetables. Changes in the diet involve  replacing refined carbohydrates with unrefined foods. The calorie level of the diet is essentially unchanged. The Dietary Reference Intake (recommended amount) for adult males is 38 grams per day. For adult females, it is 25 grams per day. Pregnant and lactating women should consume 28 grams of fiber per day. Fiber is the intact part of a plant that is not broken down during digestion. Functional fiber is fiber that has been isolated from the plant to provide a beneficial effect in the body. PURPOSE  Increase stool bulk.   Ease and regulate bowel movements.   Lower cholesterol.  REDUCE RISK OF COLON CANCER  INDICATIONS THAT YOU NEED MORE FIBER  Constipation and hemorrhoids.   Uncomplicated diverticulosis (intestine condition) and irritable bowel syndrome.   Weight management.   As a protective measure against hardening of the arteries (atherosclerosis), diabetes, and cancer.   GUIDELINES FOR INCREASING FIBER IN THE DIET  Start adding fiber to the diet slowly. A gradual increase of about 5 more grams (2 slices of whole-wheat bread, 2 servings of most fruits or vegetables, or 1 bowl of high-fiber cereal) per day is best. Too rapid an increase in fiber may result in constipation, flatulence, and bloating.   Drink enough water and fluids to keep your urine clear or pale yellow. Water, juice, or caffeine-free drinks are recommended. Not drinking enough fluid may cause constipation.   Eat a variety of high-fiber foods rather than one type of fiber.   Try to increase your intake of fiber through using high-fiber foods rather than fiber pills or supplements that contain small amounts of fiber.   The goal is to change the types of food eaten. Do not supplement your present diet with high-fiber foods, but replace foods in your present diet.   INCLUDE A VARIETY OF FIBER SOURCES  Replace refined and processed grains with whole grains, canned fruits with fresh fruits, and incorporate other  fiber sources. White rice, white breads, and most bakery goods contain little or no fiber.   Brown whole-grain rice, buckwheat oats, and many fruits and vegetables are all good sources of fiber. These include: broccoli, Brussels sprouts, cabbage, cauliflower, beets, sweet potatoes, white potatoes (skin on), carrots, tomatoes, eggplant, squash, berries, fresh fruits, and dried fruits.   Cereals appear to be the richest source of fiber. Cereal fiber is found in whole grains and bran. Bran is the fiber-rich outer coat of cereal grain, which is largely removed in refining. In whole-grain cereals, the bran remains. In breakfast cereals, the largest amount of fiber is found in those with "bran" in their names. The fiber content is sometimes indicated on the label.   You may need to include additional fruits and vegetables each day.   In baking, for 1 cup white flour, you may use the following substitutions:   1 cup whole-wheat flour minus 2  tablespoons.   1/2 cup white flour plus 1/2 cup whole-wheat flour.   Diverticulosis Diverticulosis is a common condition that develops when small pouches (diverticula) form in the wall of the colon. The risk of diverticulosis increases with age. It happens more often in people who eat a low-fiber diet. Most individuals with diverticulosis have no symptoms. Those individuals with symptoms usually experience belly (abdominal) pain, constipation, or loose stools (diarrhea).  HOME CARE INSTRUCTIONS  Increase the amount of fiber in your diet as directed by your caregiver or dietician. This may reduce symptoms of diverticulosis.   Drink at least 6 to 8 glasses of water each day to prevent constipation.   Try not to strain when you have a bowel movement.   Avoiding nuts and seeds to prevent complications is NOT NECESSARY.   FOODS HAVING HIGH FIBER CONTENT INCLUDE:  Fruits. Apple, peach, pear, tangerine, raisins, prunes.   Vegetables. Brussels sprouts, asparagus,  broccoli, cabbage, carrot, cauliflower, romaine lettuce, spinach, summer squash, tomato, winter squash, zucchini.   Starchy Vegetables. Baked beans, kidney beans, lima beans, split peas, lentils, potatoes (with skin).   Grains. Whole wheat bread, brown rice, bran flake cereal, plain oatmeal, white rice, shredded wheat, bran muffins.   SEEK IMMEDIATE MEDICAL CARE IF:  You develop increasing pain or severe bloating.   You have an oral temperature above 101F.   You develop vomiting or bowel movements that are bloody or black.   Hemorrhoids Hemorrhoids are dilated (enlarged) veins around the rectum. Sometimes clots will form in the veins. This makes them swollen and painful. These are called thrombosed hemorrhoids. Causes of hemorrhoids include:  Constipation.   Straining to have a bowel movement.   HEAVY LIFTING  HOME CARE INSTRUCTIONS  Eat a well balanced diet and drink 6 to 8 glasses of water every day to avoid constipation. You may also use a bulk laxative.   Avoid straining to have bowel movements.   Keep anal area dry and clean.   Do not use a donut shaped pillow or sit on the toilet for long periods. This increases blood pooling and pain.   Move your bowels when your body has the urge; this will require less straining and will decrease pain and pressure.

## 2015-09-18 NOTE — H&P (Signed)
  Primary Care Physician:  Collene Mares, PA-C Primary Gastroenterologist:  Dr. Oneida Alar  Pre-Procedure History & Physical: HPI:  Louis Barnett is a 75 y.o. male here for PERSONAL HISTORY OF POLYPS.  Past Medical History  Diagnosis Date  . TIA (transient ischemic attack) 20002  . GERD (gastroesophageal reflux disease)   . High triglycerides   . Arthritis   . History of nephrolithiasis   . Ureteral stone     Left  . Microscopic hematuria   . 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 Cystoscopy  2007  . Cystoscopy with ureteroscopy  07/18/2011  . Colonoscopy  08/12/2006    [facility];mild divertlculosis in colon/sessile polyps in the rectum    Prior to Admission medications   Medication Sig Start Date End Date Taking? Authorizing Provider  clopidogrel (PLAVIX) 75 MG tablet Take 75 mg by mouth every morning.     Yes Historical Provider, MD  diphenhydramine-acetaminophen (TYLENOL PM) 25-500 MG TABS tablet Take 1 tablet by mouth at bedtime as needed (sleep).   Yes Historical Provider, MD  fenofibrate (TRICOR) 48 MG tablet Take 48 mg by mouth every morning.    Yes Historical Provider, MD  polyethylene glycol-electrolytes (NULYTELY/GOLYTELY) 420 g solution Take 4,000 mLs by mouth once. 09/05/15  Yes Carlis Stable, NP    Allergies as of 09/05/2015 - Review Complete 09/05/2015  Allergen Reaction Noted  . Penicillins Rash 07/15/2011    Family History  Problem Relation Age of Onset  . Heart disease Father     heart attack  . Other Mother     varicose veins  . Diabetes Brother   . Heart disease Brother     heart attack  . Colon cancer Neg Hx     Social History   Social History  . Marital Status: Married    Spouse Name: N/A  . Number of Children: N/A  . Years of Education: N/A   Occupational History  . Not on file.   Social History Main Topics  . Smoking status: Never Smoker   . Smokeless tobacco: Never Used  . Alcohol Use: No   . Drug Use: No  . Sexual Activity: Not on file   Other Topics Concern  . Not on file   Social History Narrative    Review of Systems: See HPI, otherwise negative ROS   Physical Exam: BP 147/77 mmHg  Pulse 65  Temp(Src) 97.5 F (36.4 C) (Oral)  Resp 16  Ht 5\' 9"  (1.753 m)  Wt 165 lb (74.844 kg)  BMI 24.36 kg/m2  SpO2 97% General:   Alert,  pleasant and cooperative in NAD Head:  Normocephalic and atraumatic. Neck:  Supple; Lungs:  Clear throughout to auscultation.    Heart:  Regular rate and rhythm. Abdomen:  Soft, nontender and nondistended. Normal bowel sounds, without guarding, and without rebound.   Neurologic:  Alert and  oriented x4;  grossly normal neurologically.  Impression/Plan:    PERSONAL HISTORY OF POLYPS.  PLAN:  1. TCS TODAY

## 2015-09-21 ENCOUNTER — Encounter (HOSPITAL_COMMUNITY): Payer: Self-pay | Admitting: Gastroenterology

## 2015-09-21 DIAGNOSIS — L03012 Cellulitis of left finger: Secondary | ICD-10-CM | POA: Diagnosis not present

## 2015-09-21 DIAGNOSIS — S6992XA Unspecified injury of left wrist, hand and finger(s), initial encounter: Secondary | ICD-10-CM | POA: Diagnosis not present

## 2015-09-21 DIAGNOSIS — M19042 Primary osteoarthritis, left hand: Secondary | ICD-10-CM | POA: Diagnosis not present

## 2015-09-26 NOTE — Progress Notes (Signed)
Chief Complaint: Follow-up of right renal lesion.  History of Present Illness: Louis Barnett is a 75 y.o. male originally referred by Dr. Alinda Money in 2014 for evaluation of a 1.3 cm complex interpolar lesion of the right kidney demonstrating mild contrast enhancement. This was followed by MRI in 2014 and 2015. 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 The patient remains a symptomatic.  Past Medical History  Diagnosis Date  . TIA (transient ischemic attack) 20002  . GERD (gastroesophageal reflux disease)   . High triglycerides   . Arthritis   . History of nephrolithiasis   . Ureteral stone     Left  . Microscopic hematuria   . 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 Cystoscopy  2007  . Cystoscopy with ureteroscopy  07/18/2011  . Colonoscopy  08/12/2006    [facility];mild divertlculosis in colon/sessile polyps in the rectum  . Colonoscopy N/A 09/18/2015    Procedure: COLONOSCOPY;  Surgeon: Danie Binder, MD;  Location: [facility];  Service: Endoscopy;  Laterality: N/A;  1330    Allergies: Penicillins  Medications: Prior to Admission medications   Medication Sig Start Date End Date Taking? Authorizing Provider  clopidogrel (PLAVIX) 75 MG tablet Take 75 mg by mouth every morning.     Yes Historical Provider, MD  fenofibrate (TRICOR) 48 MG tablet Take 48 mg by mouth every morning.    Yes Historical Provider, MD  diphenhydramine-acetaminophen (TYLENOL PM) 25-500 MG TABS tablet Take 1 tablet by mouth at bedtime as needed (sleep).    Historical Provider, MD     Family History  Problem Relation Age of Onset  . Heart disease Father     heart attack  . Other Mother     varicose veins  . Diabetes Brother   . Heart disease Brother     heart attack  . Colon cancer Neg Hx     Social History   Social History  . Marital Status:  Married    Spouse Name: N/A  . Number of Children: N/A  . Years of Education: N/A   Social History Main Topics  . Smoking status: Never Smoker   . Smokeless tobacco: Never Used  . Alcohol Use: No  . Drug Use: No  . Sexual Activity: Not on file   Other Topics Concern  . Not on file   Social History Narrative    Review of Systems: A 12 point ROS discussed and pertinent positives are indicated in the HPI above.  All other systems are negative.  Review of Systems  Constitutional: Negative.   Respiratory: Negative.   Cardiovascular: Negative.   Gastrointestinal: Negative.   Genitourinary: Negative.   Musculoskeletal: Negative.   Neurological: Negative.     Vital Signs: BP 141/70 mmHg  Pulse 84  Temp(Src) 98 F (36.7 C) (Oral)  Resp 15  Ht '5\' 8"'  (1.727 m)  Wt 165 lb (74.844 kg)  BMI 25.09 kg/m2  SpO2 99%  Physical Exam  Constitutional: He is oriented to person, place, and time. He appears well-developed and well-nourished. No distress.  Abdominal: Soft. He exhibits no distension and no mass. There is no tenderness. There is no rebound and no guarding.  Musculoskeletal: He exhibits no edema.  Neurological: He is alert and oriented to person, place, and time.  Skin: He is not diaphoretic.  Nursing note and  vitals reviewed.   Mallampati Score:     Imaging: Mr Abdomen Wo Contrast  09/05/2015  CLINICAL DATA:  Follow-up right renal lesion EXAM: MRI ABDOMEN WITHOUT CONTRAST TECHNIQUE: Multiplanar multisequence MR imaging was performed without the administration of intravenous contrast. COMPARISON:  CT abdomen pelvis dated 09/06/2014. MRI abdomen dated 07/26/2014. FINDINGS: Lower chest:  Lung bases are clear. Hepatobiliary: Small hepatic cysts measuring up to 12 mm in the left hepatic dome (series 4/image 8). Liver is otherwise within normal limits. No hepatic steatosis. Gallbladder is unremarkable. No intrahepatic or extrahepatic ductal dilatation. Pancreas: Within normal  limits. Spleen: Within normal limits. Adrenals/Urinary Tract: Adrenal glands are within normal limits. Scattered small left renal cysts measuring up to 11 mm in the left upper pole (series 4/image 28). Stable 1.4 x 1.0 cm lateral interpolar right renal lesion with intrinsic T1 hyperintensity (series 900/ image 58), technically indeterminate in the absence of contrast administration, but unchanged from prior studies and favored to reflect a benign hemorrhagic cyst. No hydronephrosis. Stomach/Bowel: Stomach and visualized bowel are unremarkable. Vascular/Lymphatic: No evidence of abdominal aortic aneurysm. No suspicious abdominal lymphadenopathy. Other: No abdominal ascites. Musculoskeletal: No focal osseous lesions. IMPRESSION: Stable 1.4 cm interpolar right renal lesion, unchanged from prior studies, favored to reflect a benign hemorrhagic cyst. Scattered left renal cysts measuring up to 1.1 cm. Electronically Signed   By: Julian Hy M.D.   On: 09/05/2015 13:52    Labs:  CBC: No results for input(s): WBC, HGB, HCT, PLT in the last 8760 hours.  COAGS: No results for input(s): INR, APTT in the last 8760 hours.  BMP: No results for input(s): NA, K, CL, CO2, GLUCOSE, BUN, CALCIUM, CREATININE, GFRNONAA, GFRAA in the last 8760 hours.  Invalid input(s): CMP  LIVER FUNCTION TESTS: No results for input(s): BILITOT, AST, ALT, ALKPHOS, PROT, ALBUMIN in the last 8760 hours.  TUMOR MARKERS: No results for input(s): AFPTM, CEA, CA199, CHROMGRNA in the last 8760 hours.  Assessment and Plan:  I met with Louis Barnett and reviewed follow-up MRI of the abdomen dated 09/05/2015. This was performed without contrast due to renal insufficiency. This demonstrates a stable 1.4 cm lateral interpolar right renal lesion which is unchanged and favored to represent a benign hemorrhagic cyst. I did not recommend any further routine imaging of the kidneys due to lesion stability and findings consistent with a hemorrhagic  cyst. A splenic artery aneurysm is being followed by Dr. Donnetta Hutching and he will continue to have some periodic CTA evaluations. This will allow periodic imaging of the kidneys, as well, to confirm stability of the right renal lesion.   Electronically SignedAletta Edouard T 09/26/2015, 2:11 PM     I spent a total of 15 Minutes in face to face in clinical consultation, greater than 50% of which was counseling/coordinating care for a right renal lesion.

## 2015-10-03 ENCOUNTER — Telehealth: Payer: Self-pay | Admitting: Gastroenterology

## 2015-10-03 NOTE — Telephone Encounter (Signed)
Please call pt. HE had simple adenomas removed. FOLLOW A HIGH FIBER DIET. TCS IN 10 YEARS IF THE BENEFITS OUTWEIGH THE RISKS.

## 2015-10-04 NOTE — Telephone Encounter (Signed)
Pt is aware of results. 

## 2015-10-04 NOTE — Telephone Encounter (Signed)
Reminder in epic °

## 2016-02-29 DIAGNOSIS — Z6826 Body mass index (BMI) 26.0-26.9, adult: Secondary | ICD-10-CM | POA: Diagnosis not present

## 2016-02-29 DIAGNOSIS — Z0000 Encounter for general adult medical examination without abnormal findings: Secondary | ICD-10-CM | POA: Diagnosis not present

## 2016-02-29 DIAGNOSIS — Z125 Encounter for screening for malignant neoplasm of prostate: Secondary | ICD-10-CM | POA: Diagnosis not present

## 2016-02-29 DIAGNOSIS — E782 Mixed hyperlipidemia: Secondary | ICD-10-CM | POA: Diagnosis not present

## 2016-02-29 DIAGNOSIS — M1991 Primary osteoarthritis, unspecified site: Secondary | ICD-10-CM | POA: Diagnosis not present

## 2016-02-29 DIAGNOSIS — N183 Chronic kidney disease, stage 3 (moderate): Secondary | ICD-10-CM | POA: Diagnosis not present

## 2016-02-29 DIAGNOSIS — R5383 Other fatigue: Secondary | ICD-10-CM | POA: Diagnosis not present

## 2016-02-29 DIAGNOSIS — Z1389 Encounter for screening for other disorder: Secondary | ICD-10-CM | POA: Diagnosis not present

## 2016-03-03 ENCOUNTER — Emergency Department (HOSPITAL_COMMUNITY)
Admission: EM | Admit: 2016-03-03 | Discharge: 2016-03-03 | Disposition: A | Discharge status: Home / Self Care | Payer: Medicare Other | Attending: Emergency Medicine | Admitting: Emergency Medicine

## 2016-03-03 ENCOUNTER — Encounter (HOSPITAL_COMMUNITY): Payer: Self-pay | Admitting: Emergency Medicine

## 2016-03-03 ENCOUNTER — Emergency Department (HOSPITAL_COMMUNITY): Payer: Medicare Other

## 2016-03-03 DIAGNOSIS — R109 Unspecified abdominal pain: Secondary | ICD-10-CM | POA: Diagnosis not present

## 2016-03-03 DIAGNOSIS — R52 Pain, unspecified: Secondary | ICD-10-CM

## 2016-03-03 DIAGNOSIS — Z79899 Other long term (current) drug therapy: Secondary | ICD-10-CM | POA: Diagnosis not present

## 2016-03-03 DIAGNOSIS — N200 Calculus of kidney: Secondary | ICD-10-CM | POA: Diagnosis not present

## 2016-03-03 DIAGNOSIS — N132 Hydronephrosis with renal and ureteral calculous obstruction: Secondary | ICD-10-CM | POA: Diagnosis not present

## 2016-03-03 LAB — COMPREHENSIVE METABOLIC PANEL
ALBUMIN: 4.8 g/dL (ref 3.5–5.0)
ALT: 19 U/L (ref 17–63)
ANION GAP: 3 — AB (ref 5–15)
AST: 22 U/L (ref 15–41)
Alkaline Phosphatase: 56 U/L (ref 38–126)
BUN: 28 mg/dL — ABNORMAL HIGH (ref 6–20)
CALCIUM: 9 mg/dL (ref 8.9–10.3)
CO2: 29 mmol/L (ref 22–32)
Chloride: 109 mmol/L (ref 101–111)
Creatinine, Ser: 1.65 mg/dL — ABNORMAL HIGH (ref 0.61–1.24)
GFR calc non Af Amer: 39 mL/min — ABNORMAL LOW (ref >60)
GFR, EST AFRICAN AMERICAN: 45 mL/min — AB (ref >60)
GLUCOSE: 81 mg/dL (ref 65–99)
POTASSIUM: 3.6 mmol/L (ref 3.5–5.1)
SODIUM: 141 mmol/L (ref 135–145)
Total Bilirubin: 1 mg/dL (ref 0.3–1.2)
Total Protein: 7.7 g/dL (ref 6.5–8.1)

## 2016-03-03 LAB — CBC WITH DIFFERENTIAL/PLATELET
Basophils Absolute: 0 10*3/uL (ref 0.0–0.1)
Basophils Relative: 0 %
Eosinophils Absolute: 0.3 10*3/uL (ref 0.0–0.7)
Eosinophils Relative: 5 %
HCT: 50.4 % (ref 39.0–52.0)
Hemoglobin: 17.4 g/dL — ABNORMAL HIGH (ref 13.0–17.0)
Lymphocytes Relative: 20 %
Lymphs Abs: 1.1 10*3/uL (ref 0.7–4.0)
MCH: 33.4 pg (ref 26.0–34.0)
MCHC: 34.5 g/dL (ref 30.0–36.0)
MCV: 96.7 fL (ref 78.0–100.0)
Monocytes Absolute: 0.5 10*3/uL (ref 0.1–1.0)
Monocytes Relative: 10 %
Neutro Abs: 3.7 10*3/uL (ref 1.7–7.7)
Neutrophils Relative %: 65 %
Platelets: 146 10*3/uL — ABNORMAL LOW (ref 150–400)
RBC: 5.21 MIL/uL (ref 4.22–5.81)
RDW: 12.6 % (ref 11.5–15.5)
WBC: 5.6 10*3/uL (ref 4.0–10.5)

## 2016-03-03 LAB — URINALYSIS, ROUTINE W REFLEX MICROSCOPIC
Bilirubin Urine: NEGATIVE
Glucose, UA: NEGATIVE mg/dL
Leukocytes, UA: NEGATIVE
Nitrite: NEGATIVE
Protein, ur: 30 mg/dL — AB
Specific Gravity, Urine: 1.025 (ref 1.005–1.030)
pH: 6 (ref 5.0–8.0)

## 2016-03-03 LAB — URINE MICROSCOPIC-ADD ON

## 2016-03-03 MED ORDER — KETOROLAC TROMETHAMINE 30 MG/ML IJ SOLN
15.0000 mg | Freq: Once | INTRAMUSCULAR | Status: AC
  Administered 2016-03-03: 15 mg via INTRAVENOUS
  Filled 2016-03-03: qty 1

## 2016-03-03 MED ORDER — HYDROMORPHONE HCL 1 MG/ML IJ SOLN
0.5000 mg | Freq: Once | INTRAMUSCULAR | Status: AC
  Administered 2016-03-03: 0.5 mg via INTRAVENOUS
  Filled 2016-03-03: qty 1

## 2016-03-03 MED ORDER — SULFAMETHOXAZOLE-TRIMETHOPRIM 400-80 MG PO TABS: ORAL_TABLET | ORAL | 0 refills | Status: DC

## 2016-03-03 MED ORDER — SULFAMETHOXAZOLE-TRIMETHOPRIM 800-160 MG PO TABS
1.0000 | ORAL_TABLET | Freq: Once | ORAL | Status: AC
  Administered 2016-03-03: 1 via ORAL
  Filled 2016-03-03: qty 1

## 2016-03-03 MED ORDER — SODIUM CHLORIDE 0.9 % IV BOLUS (SEPSIS)
1000.0000 mL | Freq: Once | INTRAVENOUS | Status: AC
  Administered 2016-03-03: 1000 mL via INTRAVENOUS

## 2016-03-03 MED ORDER — OXYCODONE-ACETAMINOPHEN 5-325 MG PO TABS: 1.0000 | ORAL_TABLET | Freq: Four times a day (QID) | ORAL | 0 refills | Status: DC | PRN

## 2016-03-03 MED ORDER — ONDANSETRON 4 MG PO TBDP: ORAL_TABLET | ORAL | 0 refills | Status: DC

## 2016-03-03 MED ORDER — ONDANSETRON HCL 4 MG/2ML IJ SOLN
4.0000 mg | Freq: Once | INTRAMUSCULAR | Status: AC
  Administered 2016-03-03: 4 mg via INTRAVENOUS
  Filled 2016-03-03: qty 2

## 2016-03-03 NOTE — ED Provider Notes (Signed)
[place] DEPT Provider Note   CSN: [id number] Arrival date & time: 03/03/16  1026  First Provider Contact:  10:50 AM    By signing my name below, I, Rayna Sexton, attest that this documentation has been prepared under the direction and in the presence of Milton Ferguson, MD. Electronically Signed: Rayna Sexton, ED Scribe. 03/03/16. 10:54 AM.   History   Chief Complaint Chief Complaint  Patient presents with  . Flank Pain    HPI HPI Comments: Louis Barnett is a 75 y.o. male who presents to the Emergency Department complaining of constant, moderate, right flank pain onset at 8:00 am this morning. Pt confirms a PMHx of kidney stones and states his current pain is consistent with prior kidney stones.Pt reports associated n/v. His pain worsens with palpation. He denies fever, chills, cough or any other associated symptoms at this time.   The patient complains of severe flank pain on the right   The history is provided by the patient. No language interpreter was used.  Back Pain   This is a new problem. The current episode started 12 to 24 hours ago. The problem occurs constantly. The problem has not changed since onset.The pain is associated with no known injury. Pain location: Right flank. The pain is at a severity of 7/10. The pain is moderate. Exacerbated by: Nothing. Pertinent negatives include no chest pain, no fever, no headaches and no abdominal pain. He has tried nothing for the symptoms.    Past Medical History:  Diagnosis Date  . Arthritis   . GERD (gastroesophageal reflux disease)   . High triglycerides   . History of nephrolithiasis   . Microscopic hematuria   . Splenic artery aneurysm ([place])   . TIA (transient ischemic attack) 20002  . Ureteral stone    Left    Patient Active Problem List   Diagnosis Date Noted  . History of adenomatous polyp of colon 09/04/2015  . 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 Ureteral stone 07/18/2011    Past Surgical  History:  Procedure Laterality Date  . COLONOSCOPY  08/12/2006   [facility];mild divertlculosis in colon/sessile polyps in the rectum  . COLONOSCOPY N/A 09/18/2015   Procedure: COLONOSCOPY;  Surgeon: Danie Binder, MD;  Location: [facility];  Service: Endoscopy;  Laterality: N/A;  1330  . CYSTOSCOPY  2007  . CYSTOSCOPY WITH URETEROSCOPY  07/18/2011       Home Medications    Prior to Admission medications   Medication Sig Start Date End Date Taking? Authorizing Provider  clopidogrel (PLAVIX) 75 MG tablet Take 75 mg by mouth every morning.      Historical Provider, MD  diphenhydramine-acetaminophen (TYLENOL PM) 25-500 MG TABS tablet Take 1 tablet by mouth at bedtime as needed (sleep).    Historical Provider, MD  fenofibrate (TRICOR) 48 MG tablet Take 48 mg by mouth every morning.     Historical Provider, MD    Family History Family History  Problem Relation Age of Onset  . Heart disease Father     heart attack  . Other Mother     varicose veins  . Diabetes Brother   . Heart disease Brother     heart attack  . Colon cancer Neg Hx     Social History Social History  Substance Use Topics  . Smoking status: Never Smoker  . Smokeless tobacco: Never Used  . Alcohol use No     Allergies   Penicillins   Review of Systems Review  of Systems  Constitutional: Negative for appetite change, chills, fatigue and fever.  HENT: Negative for congestion, ear discharge and sinus pressure.   Eyes: Negative for discharge.  Respiratory: Negative for cough.   Cardiovascular: Negative for chest pain.  Gastrointestinal: Positive for nausea and vomiting. Negative for abdominal pain and diarrhea.  Genitourinary: Positive for flank pain. Negative for frequency and hematuria.  Musculoskeletal: Negative for back pain.  Skin: Negative for rash.  Neurological: Negative for seizures and headaches.  Psychiatric/Behavioral: Negative for hallucinations.   Physical Exam Updated Vital Signs BP  160/71 (BP Location: Right Arm)   Pulse 65   Temp 97.8 F (36.6 C) (Oral)   Resp 16   Ht 5\' 8"  (1.727 m)   Wt 168 lb (76.2 kg)   SpO2 98%   BMI 25.54 kg/m   Physical Exam  Constitutional: He is oriented to person, place, and time. He appears well-developed.  HENT:  Head: Normocephalic.  Eyes: Conjunctivae and EOM are normal. No scleral icterus.  Neck: Neck supple. No thyromegaly present.  Cardiovascular: Normal rate and regular rhythm.  Exam reveals no gallop and no friction rub.   No murmur heard. Pulmonary/Chest: No stridor. He has no wheezes. He has no rales. He exhibits no tenderness.  Abdominal: He exhibits no distension. There is no tenderness. There is CVA tenderness. There is no rebound.  Moderate right flank tenderness  Musculoskeletal: Normal range of motion. He exhibits no edema.  Lymphadenopathy:    He has no cervical adenopathy.  Neurological: He is oriented to person, place, and time. He exhibits normal muscle tone. Coordination normal.  Skin: No rash noted. No erythema.  Psychiatric: He has a normal mood and affect. His behavior is normal.   ED Treatments / Results  Labs (all labs ordered are listed, but only abnormal results are displayed) Labs Reviewed  CBC WITH DIFFERENTIAL/PLATELET - Abnormal; Notable for the following:       Result Value   Hemoglobin 17.4 (*)    Platelets 146 (*)    All other components within normal limits  COMPREHENSIVE METABOLIC PANEL - Abnormal; Notable for the following:    BUN 28 (*)    Creatinine, Ser 1.65 (*)    GFR calc non Af Amer 39 (*)    GFR calc Af Amer 45 (*)    Anion gap 3 (*)    All other components within normal limits  URINALYSIS, ROUTINE W REFLEX MICROSCOPIC (NOT AT [facility]) - Abnormal; Notable for the following:    Hgb urine dipstick LARGE (*)    Ketones, ur TRACE (*)    Protein, ur 30 (*)    All other components within normal limits  URINE MICROSCOPIC-ADD ON - Abnormal; Notable for the following:    Squamous  Epithelial / LPF 0-5 (*)    Bacteria, UA FEW (*)    All other components within normal limits  URINE CULTURE    EKG  EKG Interpretation None       Radiology Ct Renal Stone Study  Result Date: 03/03/2016 CLINICAL DATA:  75 year old male with right flank pain and history of kidney stones EXAM: CT ABDOMEN AND PELVIS WITHOUT CONTRAST TECHNIQUE: Multidetector CT imaging of the abdomen and pelvis was performed following the standard protocol without IV contrast. COMPARISON:  Prior abdominal MRI 09/05/2015; prior CT scan of the abdomen and pelvis 09/06/2014 and 09/01/2012 FINDINGS: Lower chest:  No acute findings. Hepatobiliary: No solid mass visualized on this un-enhanced exam. Focal low-attenuation lesions in hepatic segment 2 and  5 consistent with simple cysts as seen on prior MRI imaging. Gallbladder is unremarkable. No intra or extrahepatic biliary ductal dilatation. Pancreas: No mass or inflammatory process identified on this un-enhanced exam. Spleen: Within normal limits in size. Adrenals/Urinary Tract: Moderate right hydronephrosis with renal edema and perinephric stranding. There is an obstructing stone in the proximal ureter which measures approximately 5 x 7 mm. Stable left renal cysts. Unchanged intermediate attenuation right renal lesion which was previously characterized is a benign hemorrhagic cyst. Punctate nonobstructing stone in the interpolar left kidney. Stomach/Bowel: No evidence of obstruction, inflammatory process, or abnormal fluid collections. Colonic diverticular disease without CT evidence of active inflammation. Vascular/Lymphatic: Limited evaluation in the absence of intravenous contrast. 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 Trace atherosclerotic calcifications in the abdominal aorta. No aneurysm. Reproductive: Prostatomegaly Other: None. Musculoskeletal:  No suspicious bone lesions identified. IMPRESSION: 1. Obstructing 5 x 7 mm stone  in the proximal right ureter with resultant moderate right hydronephrosis, renal edema and perinephric stranding. 2. Additional punctate nonobstructing stone in the interpolar left renal collecting system. 3. 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 4. Colonic diverticular disease without CT evidence of active inflammation. 5.  Aortic Atherosclerosis (ICD10-170.0) 6. Additional ancillary findings as above without significant interval change. Electronically Signed   By: Jacqulynn Cadet M.D.   On: 03/03/2016 11:38    Procedures Procedures  DIAGNOSTIC STUDIES: Oxygen Saturation is 98% on RA, normal by my interpretation.    COORDINATION OF CARE: 10:53 AM Discussed next steps with pt. Pt verbalized understanding and is agreeable with the plan.    Medications Ordered in ED Medications - No data to display   Initial Impression / Assessment and Plan / ED Course  I have reviewed the triage vital signs and the nursing notes.  Pertinent labs & imaging results that were available during my care of the patient were reviewed by me and considered in my medical decision making (see chart for details).  Clinical Course    Patient has a 5 x 7 mm stone right proximal ureter. Symptoms are improved with pain medicine nausea medicine. I spoke with urology and it was felt the patient could be discharged home and follow-up in office he will have his urine cultured and will be put on Bactrim  The chart was scribed for me under my direct supervision.  I personally performed the history, physical, and medical decision making and all procedures in the evaluation of this patient..   Final Clinical Impressions(s) / ED Diagnoses   Final diagnoses:  None    New Prescriptions New Prescriptions   No medications on file     Milton Ferguson, MD 03/03/16 [phone]

## 2016-03-03 NOTE — ED Triage Notes (Signed)
Patient c/o right flank pain that radiates onto abd. Per patient started this morning , approx 2 hours prior to coming to ER. Per patient nausea and vomiting with pain. Patient used bathroom prior to coming back to room. Patient states "I feel like it eased my pain some." Per patient  hx of kidney stones

## 2016-03-03 NOTE — ED Notes (Signed)
Pt had just urinated before coming into the ED. Pt stated he will give [place] a sample when he has to go again.

## 2016-03-03 NOTE — Discharge Instructions (Signed)
Follow-up with urology in 1-2 days. If your problems prior to that time go to [facility] to be seen

## 2016-03-04 DIAGNOSIS — N201 Calculus of ureter: Secondary | ICD-10-CM | POA: Diagnosis not present

## 2016-03-05 ENCOUNTER — Other Ambulatory Visit: Payer: Self-pay | Admitting: Urology

## 2016-03-05 LAB — URINE CULTURE: CULTURE: NO GROWTH

## 2016-03-06 ENCOUNTER — Encounter (HOSPITAL_COMMUNITY): Payer: Self-pay | Admitting: *Deleted

## 2016-03-11 ENCOUNTER — Ambulatory Visit (HOSPITAL_COMMUNITY): Payer: Medicare Other

## 2016-03-11 ENCOUNTER — Ambulatory Visit (HOSPITAL_COMMUNITY)
Admission: RE | Admit: 2016-03-11 | Discharge: 2016-03-11 | Disposition: A | Discharge status: Home / Self Care | Payer: Medicare Other | Source: Ambulatory Visit | Attending: Urology | Admitting: Urology

## 2016-03-11 ENCOUNTER — Encounter (HOSPITAL_COMMUNITY): Payer: Self-pay | Admitting: General Practice

## 2016-03-11 ENCOUNTER — Encounter (HOSPITAL_COMMUNITY)
Admission: RE | Disposition: A | Discharge status: Home / Self Care | Payer: Self-pay | Source: Ambulatory Visit | Attending: Urology

## 2016-03-11 DIAGNOSIS — Z7902 Long term (current) use of antithrombotics/antiplatelets: Secondary | ICD-10-CM | POA: Insufficient documentation

## 2016-03-11 DIAGNOSIS — N201 Calculus of ureter: Secondary | ICD-10-CM | POA: Diagnosis not present

## 2016-03-11 DIAGNOSIS — N200 Calculus of kidney: Secondary | ICD-10-CM

## 2016-03-11 DIAGNOSIS — Z8673 Personal history of transient ischemic attack (TIA), and cerebral infarction without residual deficits: Secondary | ICD-10-CM | POA: Insufficient documentation

## 2016-03-11 SURGERY — LITHOTRIPSY, ESWL
Anesthesia: LOCAL | Laterality: Right

## 2016-03-11 MED ORDER — CIPROFLOXACIN HCL 500 MG PO TABS
500.0000 mg | ORAL_TABLET | ORAL | Status: AC
  Administered 2016-03-11: 500 mg via ORAL
  Filled 2016-03-11: qty 1

## 2016-03-11 MED ORDER — DIPHENHYDRAMINE HCL 25 MG PO CAPS
25.0000 mg | ORAL_CAPSULE | ORAL | Status: AC
  Administered 2016-03-11: 25 mg via ORAL
  Filled 2016-03-11: qty 1

## 2016-03-11 MED ORDER — DIAZEPAM 5 MG PO TABS
10.0000 mg | ORAL_TABLET | ORAL | Status: AC
  Administered 2016-03-11: 10 mg via ORAL
  Filled 2016-03-11: qty 2

## 2016-03-11 MED ORDER — SODIUM CHLORIDE 0.9 % IV SOLN
INTRAVENOUS | Status: DC
  Administered 2016-03-11: 07:00:00 via INTRAVENOUS

## 2016-03-21 DIAGNOSIS — R7989 Other specified abnormal findings of blood chemistry: Secondary | ICD-10-CM | POA: Diagnosis not present

## 2016-04-08 ENCOUNTER — Ambulatory Visit (INDEPENDENT_AMBULATORY_CARE_PROVIDER_SITE_OTHER): Payer: Medicare Other | Admitting: Otolaryngology

## 2016-04-25 ENCOUNTER — Ambulatory Visit (INDEPENDENT_AMBULATORY_CARE_PROVIDER_SITE_OTHER): Payer: Medicare Other | Admitting: Otolaryngology

## 2016-04-25 DIAGNOSIS — H6123 Impacted cerumen, bilateral: Secondary | ICD-10-CM

## 2016-04-25 DIAGNOSIS — H903 Sensorineural hearing loss, bilateral: Secondary | ICD-10-CM | POA: Diagnosis not present

## 2016-06-28 ENCOUNTER — Encounter (HOSPITAL_COMMUNITY): Payer: Self-pay | Admitting: *Deleted

## 2016-06-28 ENCOUNTER — Emergency Department (HOSPITAL_COMMUNITY)
Admission: EM | Admit: 2016-06-28 | Discharge: 2016-06-28 | Disposition: A | Discharge status: Home / Self Care | Payer: Medicare Other | Attending: Emergency Medicine | Admitting: Emergency Medicine

## 2016-06-28 ENCOUNTER — Emergency Department (HOSPITAL_COMMUNITY): Payer: Medicare Other

## 2016-06-28 DIAGNOSIS — Z79899 Other long term (current) drug therapy: Secondary | ICD-10-CM | POA: Insufficient documentation

## 2016-06-28 DIAGNOSIS — R112 Nausea with vomiting, unspecified: Secondary | ICD-10-CM | POA: Diagnosis not present

## 2016-06-28 DIAGNOSIS — S4992XA Unspecified injury of left shoulder and upper arm, initial encounter: Secondary | ICD-10-CM | POA: Diagnosis not present

## 2016-06-28 DIAGNOSIS — M25512 Pain in left shoulder: Secondary | ICD-10-CM | POA: Diagnosis not present

## 2016-06-28 DIAGNOSIS — M79622 Pain in left upper arm: Secondary | ICD-10-CM | POA: Insufficient documentation

## 2016-06-28 DIAGNOSIS — M79602 Pain in left arm: Secondary | ICD-10-CM

## 2016-06-28 LAB — BASIC METABOLIC PANEL
Anion gap: 4 — ABNORMAL LOW (ref 5–15)
BUN: 26 mg/dL — AB (ref 6–20)
CHLORIDE: 108 mmol/L (ref 101–111)
CO2: 27 mmol/L (ref 22–32)
CREATININE: 1.29 mg/dL — AB (ref 0.61–1.24)
Calcium: 8.8 mg/dL — ABNORMAL LOW (ref 8.9–10.3)
GFR calc Af Amer: >60 mL/min (ref >60)
GFR calc non Af Amer: 53 mL/min — ABNORMAL LOW (ref >60)
GLUCOSE: 118 mg/dL — AB (ref 65–99)
Potassium: 4.1 mmol/L (ref 3.5–5.1)
SODIUM: 139 mmol/L (ref 135–145)

## 2016-06-28 LAB — CBC WITH DIFFERENTIAL/PLATELET
Basophils Absolute: 0 10*3/uL (ref 0.0–0.1)
Basophils Relative: 0 %
EOS ABS: 0.1 10*3/uL (ref 0.0–0.7)
EOS PCT: 2 %
HCT: 44.2 % (ref 39.0–52.0)
Hemoglobin: 15.4 g/dL (ref 13.0–17.0)
LYMPHS ABS: 0.9 10*3/uL (ref 0.7–4.0)
Lymphocytes Relative: 22 %
MCH: 33.3 pg (ref 26.0–34.0)
MCHC: 34.8 g/dL (ref 30.0–36.0)
MCV: 95.5 fL (ref 78.0–100.0)
MONO ABS: 0.3 10*3/uL (ref 0.1–1.0)
MONOS PCT: 8 %
Neutro Abs: 2.9 10*3/uL (ref 1.7–7.7)
Neutrophils Relative %: 68 %
PLATELETS: 122 10*3/uL — AB (ref 150–400)
RBC: 4.63 MIL/uL (ref 4.22–5.81)
RDW: 12.7 % (ref 11.5–15.5)
WBC: 4.2 10*3/uL (ref 4.0–10.5)

## 2016-06-28 LAB — TROPONIN I

## 2016-06-28 MED ORDER — TRAMADOL HCL 50 MG PO TABS: 50.0000 mg | ORAL_TABLET | Freq: Four times a day (QID) | ORAL | 0 refills | Status: DC | PRN

## 2016-06-28 NOTE — ED Triage Notes (Signed)
Pt comes in with left shoulder and arm pain starting 1 week ago. Denies any injury. States the pain gets better at some points. Movement of the arm doesn't change his pain. Denies any chest pain. Pt states he has had increased burping. NAD noted

## 2016-06-28 NOTE — ED Provider Notes (Signed)
[facility] DEPT Provider Note   CSN: [id number] Arrival date & time: 06/28/16  1041  By signing my name below, I, Rayna Sexton, attest that this documentation has been prepared under the direction and in the presence of Fredia Sorrow, MD. Electronically Signed: Rayna Sexton, ED Scribe. 06/28/16. 11:38 AM.   History   Chief Complaint Chief Complaint  Patient presents with  . Arm Pain    HPI HPI Comments: Louis Barnett is a 75 y.o. male who presents to the Emergency Department complaining of atraumatic, 3/10, intermittent, left shoulder pain x 1 week. He states his pain presents intermittently, typically lasts "a few minutes or sometimes an hour" and also intermittently radiates down his left arm to his left wrist. His pain typically presents at night. He denies any modifying factors. Pt reports associated intermittent burping as well as an episode of vomiting "a few days ago" but denies his arm pain was present at the time. He denies a h/o similar symptoms. No h/o MI. He is on Plavix. Pt denies neck pain, CP, chest tightness, SOB, nausea, vomiting, numbness or other associated symptoms at this time.   The history is provided by the patient and medical records. No language interpreter was used.  Arm Pain  This is a new problem. The current episode started more than 2 days ago. The problem occurs daily. The problem has not changed since onset.Pertinent negatives include no chest pain, no abdominal pain, no headaches and no shortness of breath. Nothing aggravates the symptoms. Nothing relieves the symptoms. He has tried nothing for the symptoms. The treatment provided no relief.    Past Medical History:  Diagnosis Date  . Aneurysm ([place])    spleen  . Arthritis   . GERD (gastroesophageal reflux disease)   . High triglycerides   . History of nephrolithiasis   . Microscopic hematuria   . Splenic artery aneurysm ([place])   . TIA (transient ischemic attack) 20002  . Ureteral stone    Left    Patient Active Problem List   Diagnosis Date Noted  . History of adenomatous polyp of colon 09/04/2015  . 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 Ureteral stone 07/18/2011    Past Surgical History:  Procedure Laterality Date  . COLONOSCOPY  08/12/2006   [facility];mild divertlculosis in colon/sessile polyps in the rectum  . COLONOSCOPY N/A 09/18/2015   Procedure: COLONOSCOPY;  Surgeon: Danie Binder, MD;  [facility];  Service: Endoscopy;  Laterality: N/A;  1330  . CYSTOSCOPY  2007  . CYSTOSCOPY WITH URETEROSCOPY  07/18/2011       Home Medications    Prior to Admission medications   Medication Sig Start Date End Date Taking? Authorizing Provider  clopidogrel (PLAVIX) 75 MG tablet Take 75 mg by mouth daily.    Yes Historical Provider, MD  diphenhydramine-acetaminophen (TYLENOL PM) 25-500 MG TABS tablet Take 1-2 tablets by mouth at bedtime as needed (for sleep).    Yes Historical Provider, MD  fenofibrate (TRICOR) 48 MG tablet Take 48 mg by mouth daily.    Yes Historical Provider, MD  traMADol (ULTRAM) 50 MG tablet Take 1 tablet (50 mg total) by mouth every 6 (six) hours as needed. 06/28/16   Fredia Sorrow, MD    Family History Family History  Problem Relation Age of Onset  . Heart disease Father     heart attack  . Other Mother     varicose veins  . Diabetes Brother   . Heart disease Brother  heart attack  . Colon cancer Neg Hx     Social History Social History  Substance Use Topics  . Smoking status: Never Smoker  . Smokeless tobacco: Never Used  . Alcohol use No     Allergies   Penicillins   Review of Systems Review of Systems  Constitutional: Negative for chills and fever.  HENT: Negative for congestion, postnasal drip, rhinorrhea, sinus pressure and sore throat.   Eyes: Negative for visual disturbance.  Respiratory: Negative for cough, chest tightness and shortness of breath.   Cardiovascular: Negative for chest pain and  leg swelling.  Gastrointestinal: Positive for nausea and vomiting. Negative for abdominal pain and diarrhea.  Genitourinary: Negative for dysuria and hematuria.  Musculoskeletal: Positive for arthralgias. Negative for back pain.  Skin: Negative for rash.  Neurological: Negative for syncope, light-headedness and headaches.  Hematological: Does not bruise/bleed easily.  Psychiatric/Behavioral: Negative for confusion.  All other systems reviewed and are negative.  Physical Exam Updated Vital Signs BP 142/71   Pulse (!) 58   Temp 97.9 F (36.6 C) (Oral)   Resp 13   Ht 5\' 8"  (1.727 m)   Wt 74.8 kg   SpO2 97%   BMI 25.09 kg/m   Physical Exam  Constitutional: He is oriented to person, place, and time. He appears well-developed and well-nourished. No distress.  HENT:  Head: Normocephalic and atraumatic.  Mouth/Throat: Oropharynx is clear and moist and mucous membranes are normal.  Eyes: Conjunctivae and EOM are normal. Pupils are equal, round, and reactive to light. No scleral icterus.  Neck: Normal range of motion. Neck supple. No tracheal deviation present.  Cardiovascular: Normal rate, regular rhythm, normal heart sounds and intact distal pulses.  Exam reveals no gallop and no friction rub.   No murmur heard. Pulses:      Radial pulses are 2+ on the left side.  Pulmonary/Chest: Effort normal and breath sounds normal. No respiratory distress. He has no wheezes. He has no rales.  98% on RA during examination  Abdominal: Soft. Bowel sounds are normal. There is no tenderness.  Musculoskeletal: Normal range of motion. He exhibits no edema.  No swelling noted in the bilateral ankles  Neurological: He is alert and oriented to person, place, and time. No cranial nerve deficit. He exhibits normal muscle tone. Coordination normal.  Moves extremities normally. Strength 5/5 in all extremities.   Skin: Skin is warm and dry.  Psychiatric: He has a normal mood and affect. His behavior is  normal.  Nursing note and vitals reviewed.  ED Treatments / Results  Labs (all labs ordered are listed, but only abnormal results are displayed) Labs Reviewed  BASIC METABOLIC PANEL - Abnormal; Notable for the following:       Result Value   Glucose, Bld 118 (*)    BUN 26 (*)    Creatinine, Ser 1.29 (*)    Calcium 8.8 (*)    GFR calc non Af Amer 53 (*)    Anion gap 4 (*)    All other components within normal limits  CBC WITH DIFFERENTIAL/PLATELET - Abnormal; Notable for the following:    Platelets 122 (*)    All other components within normal limits  TROPONIN I    EKG  EKG Interpretation  Date/Time:  Friday June 28 2016 10:54:03 EST Ventricular Rate:  62 PR Interval:    QRS Duration: 92 QT Interval:  381 QTC Calculation: 387 R Axis:   53 Text Interpretation:  Sinus rhythm Confirmed by Rogene Houston  MD, Sybel Standish [phone]) on 06/28/2016 10:59:15 AM       Radiology Dg Chest 2 View  Result Date: 06/28/2016 CLINICAL DATA:  Left shoulder pain without known injury. No chest complaints. EXAM: CHEST  2 VIEW COMPARISON:  Report of a chest x-ray of January 15, 2001 FINDINGS: The lungs are adequately inflated and clear. The heart and pulmonary vascularity are normal. The mediastinum is normal in width. There is no pleural effusion. There is calcification in the wall of the aortic arch. The observed bony thorax is unremarkable. IMPRESSION: There is no active cardiopulmonary disease. Electronically Signed   By: David  Martinique M.D.   On: 06/28/2016 12:56   Dg Shoulder Left  Result Date: 06/28/2016 CLINICAL DATA:  Left shoulder pain for the past week with no known injury. Pain radiates into the arm. EXAM: LEFT SHOULDER - 2+ VIEW COMPARISON:  None in PACs FINDINGS: The bones are subjectively mildly osteopenic. There is no acute or healing fracture. The glenohumeral joint space is reasonably well maintained. The articular surfaces of the humeral head and glenoid appear normal. IMPRESSION:  Degenerative changes centered on the [facility] joint. There is no acute bony abnormality. Electronically Signed   By: David  Martinique M.D.   On: 06/28/2016 12:58    Procedures Procedures  DIAGNOSTIC STUDIES: Oxygen Saturation is 100% on RA, normal by my interpretation.    COORDINATION OF CARE: 11:38 AM Discussed next steps with pt. Pt verbalized understanding and is agreeable with the plan.    Medications Ordered in ED Medications - No data to display   Initial Impression / Assessment and Plan / ED Course  I have reviewed the triage vital signs and the nursing notes.  Pertinent labs & imaging results that were available during my care of the patient were reviewed by me and considered in my medical decision making (see chart for details).  Clinical Course    Patient with intermittent upper left arm pain. Sometimes lasting seconds other times lasting for more than an hour. Does not seem to be exertional in nature. Not made worse with movement. Actually seems to be worse sometimes at night. No anterior chest pain. Today's workup troponin was negative chest x-ray negative x-ray of the left shoulder did show some degenerative changes. Patient asymptomatic while here. We'll recommend follow-up with orthopedics cardiology just to be on the safe side patient's already on Plavix. Also follow-up with primary care doctor. And tramadol as needed for the pain.  I personally performed the services described in this documentation, which was scribed in my presence. The recorded information has been reviewed and is accurate.    Final Clinical Impressions(s) / ED Diagnoses   Final diagnoses:  Left arm pain    New Prescriptions New Prescriptions   TRAMADOL (ULTRAM) 50 MG TABLET    Take 1 tablet (50 mg total) by mouth every 6 (six) hours as needed.     Fredia Sorrow, MD 06/28/16 1414

## 2016-06-28 NOTE — Discharge Instructions (Signed)
Take the tramadol as needed. Scheduled follow-up with cardiology and orthopedics for further evaluation of left arm pain. Also follow up with Primary care doctor. Return for any new or worse symptoms. Today's evaluation for the arm pain without any acute findings other some degenerative changes at the [facility] joint

## 2016-07-16 ENCOUNTER — Telehealth: Payer: Self-pay | Admitting: Vascular Surgery

## 2016-07-16 NOTE — Telephone Encounter (Signed)
As per the patient's last office note in 08/2014, Dr. Donnetta Hutching wanted the patient to return in 2018 for a follow up and cat scan. I left the patient a message to schedule these appointments.

## 2016-07-17 DIAGNOSIS — J40 Bronchitis, not specified as acute or chronic: Secondary | ICD-10-CM | POA: Diagnosis not present

## 2016-08-07 ENCOUNTER — Ambulatory Visit: Payer: Medicare Other | Admitting: Family

## 2016-09-10 ENCOUNTER — Ambulatory Visit: Payer: Medicare Other | Admitting: Family

## 2016-09-10 ENCOUNTER — Other Ambulatory Visit (HOSPITAL_COMMUNITY): Payer: Self-pay | Admitting: Family Medicine

## 2016-09-10 ENCOUNTER — Ambulatory Visit: Payer: Medicare Other | Admitting: Vascular Surgery

## 2016-09-10 ENCOUNTER — Ambulatory Visit (HOSPITAL_COMMUNITY)
Admission: RE | Admit: 2016-09-10 | Discharge: 2016-09-10 | Disposition: A | Discharge status: Home / Self Care | Payer: Medicare Other | Source: Ambulatory Visit | Attending: Family Medicine | Admitting: Family Medicine

## 2016-09-10 DIAGNOSIS — S8391XA Sprain of unspecified site of right knee, initial encounter: Secondary | ICD-10-CM | POA: Diagnosis not present

## 2016-09-10 DIAGNOSIS — M25561 Pain in right knee: Secondary | ICD-10-CM | POA: Diagnosis not present

## 2016-09-10 DIAGNOSIS — E663 Overweight: Secondary | ICD-10-CM

## 2016-09-10 DIAGNOSIS — Z23 Encounter for immunization: Secondary | ICD-10-CM | POA: Diagnosis not present

## 2016-09-10 DIAGNOSIS — Z1389 Encounter for screening for other disorder: Secondary | ICD-10-CM | POA: Diagnosis not present

## 2016-09-10 DIAGNOSIS — E782 Mixed hyperlipidemia: Secondary | ICD-10-CM | POA: Diagnosis not present

## 2016-09-10 DIAGNOSIS — Z6825 Body mass index (BMI) 25.0-25.9, adult: Secondary | ICD-10-CM | POA: Diagnosis not present

## 2016-09-18 DIAGNOSIS — M1711 Unilateral primary osteoarthritis, right knee: Secondary | ICD-10-CM | POA: Diagnosis not present

## 2016-09-18 DIAGNOSIS — M25561 Pain in right knee: Secondary | ICD-10-CM | POA: Diagnosis not present

## 2016-10-09 DIAGNOSIS — M25561 Pain in right knee: Secondary | ICD-10-CM | POA: Diagnosis not present

## 2016-10-09 DIAGNOSIS — M1711 Unilateral primary osteoarthritis, right knee: Secondary | ICD-10-CM | POA: Diagnosis not present

## 2016-10-21 DIAGNOSIS — M1711 Unilateral primary osteoarthritis, right knee: Secondary | ICD-10-CM | POA: Diagnosis not present

## 2016-10-21 DIAGNOSIS — M25561 Pain in right knee: Secondary | ICD-10-CM | POA: Diagnosis not present

## 2016-10-29 DIAGNOSIS — M25561 Pain in right knee: Secondary | ICD-10-CM | POA: Diagnosis not present

## 2016-10-29 DIAGNOSIS — M1711 Unilateral primary osteoarthritis, right knee: Secondary | ICD-10-CM | POA: Diagnosis not present

## 2016-11-05 DIAGNOSIS — M25561 Pain in right knee: Secondary | ICD-10-CM | POA: Diagnosis not present

## 2016-11-05 DIAGNOSIS — M1711 Unilateral primary osteoarthritis, right knee: Secondary | ICD-10-CM | POA: Diagnosis not present

## 2016-11-12 DIAGNOSIS — M25561 Pain in right knee: Secondary | ICD-10-CM | POA: Diagnosis not present

## 2016-11-12 DIAGNOSIS — M1711 Unilateral primary osteoarthritis, right knee: Secondary | ICD-10-CM | POA: Diagnosis not present

## 2016-12-26 DIAGNOSIS — T148XXA Other injury of unspecified body region, initial encounter: Secondary | ICD-10-CM | POA: Diagnosis not present

## 2016-12-26 DIAGNOSIS — B353 Tinea pedis: Secondary | ICD-10-CM | POA: Diagnosis not present

## 2017-02-04 DIAGNOSIS — E785 Hyperlipidemia, unspecified: Secondary | ICD-10-CM | POA: Diagnosis not present

## 2017-02-04 DIAGNOSIS — M1991 Primary osteoarthritis, unspecified site: Secondary | ICD-10-CM | POA: Diagnosis not present

## 2017-02-04 DIAGNOSIS — Z6826 Body mass index (BMI) 26.0-26.9, adult: Secondary | ICD-10-CM | POA: Diagnosis not present

## 2017-02-04 DIAGNOSIS — Z1389 Encounter for screening for other disorder: Secondary | ICD-10-CM | POA: Diagnosis not present

## 2017-02-04 DIAGNOSIS — L959 Vasculitis limited to the skin, unspecified: Secondary | ICD-10-CM | POA: Diagnosis not present

## 2017-02-04 DIAGNOSIS — E663 Overweight: Secondary | ICD-10-CM | POA: Diagnosis not present

## 2017-02-04 DIAGNOSIS — S80861A Insect bite (nonvenomous), right lower leg, initial encounter: Secondary | ICD-10-CM | POA: Diagnosis not present

## 2017-03-27 ENCOUNTER — Ambulatory Visit (INDEPENDENT_AMBULATORY_CARE_PROVIDER_SITE_OTHER): Payer: Medicare Other | Admitting: Otolaryngology

## 2017-03-27 DIAGNOSIS — H903 Sensorineural hearing loss, bilateral: Secondary | ICD-10-CM | POA: Diagnosis not present

## 2017-03-27 DIAGNOSIS — H6123 Impacted cerumen, bilateral: Secondary | ICD-10-CM | POA: Diagnosis not present

## 2017-04-07 DIAGNOSIS — Z6825 Body mass index (BMI) 25.0-25.9, adult: Secondary | ICD-10-CM | POA: Diagnosis not present

## 2017-04-07 DIAGNOSIS — Z0000 Encounter for general adult medical examination without abnormal findings: Secondary | ICD-10-CM | POA: Diagnosis not present

## 2017-04-07 DIAGNOSIS — E663 Overweight: Secondary | ICD-10-CM | POA: Diagnosis not present

## 2017-04-23 DIAGNOSIS — E7800 Pure hypercholesterolemia, unspecified: Secondary | ICD-10-CM | POA: Diagnosis not present

## 2017-04-23 DIAGNOSIS — R311 Benign essential microscopic hematuria: Secondary | ICD-10-CM | POA: Diagnosis not present

## 2017-04-23 DIAGNOSIS — R109 Unspecified abdominal pain: Secondary | ICD-10-CM | POA: Diagnosis not present

## 2017-04-23 DIAGNOSIS — Z7902 Long term (current) use of antithrombotics/antiplatelets: Secondary | ICD-10-CM | POA: Diagnosis not present

## 2017-04-23 DIAGNOSIS — M545 Low back pain: Secondary | ICD-10-CM | POA: Diagnosis not present

## 2017-04-23 DIAGNOSIS — Z79899 Other long term (current) drug therapy: Secondary | ICD-10-CM | POA: Diagnosis not present

## 2017-04-23 DIAGNOSIS — I700 Atherosclerosis of aorta: Secondary | ICD-10-CM | POA: Diagnosis not present

## 2017-04-23 DIAGNOSIS — Z87442 Personal history of urinary calculi: Secondary | ICD-10-CM | POA: Diagnosis not present

## 2017-04-23 DIAGNOSIS — K5790 Diverticulosis of intestine, part unspecified, without perforation or abscess without bleeding: Secondary | ICD-10-CM | POA: Diagnosis not present

## 2017-04-23 DIAGNOSIS — N210 Calculus in bladder: Secondary | ICD-10-CM | POA: Diagnosis not present

## 2017-04-25 DIAGNOSIS — N201 Calculus of ureter: Secondary | ICD-10-CM | POA: Diagnosis not present

## 2017-05-06 DIAGNOSIS — Z1389 Encounter for screening for other disorder: Secondary | ICD-10-CM | POA: Diagnosis not present

## 2017-05-06 DIAGNOSIS — E785 Hyperlipidemia, unspecified: Secondary | ICD-10-CM | POA: Diagnosis not present

## 2017-05-06 DIAGNOSIS — E782 Mixed hyperlipidemia: Secondary | ICD-10-CM | POA: Diagnosis not present

## 2017-05-06 DIAGNOSIS — Z125 Encounter for screening for malignant neoplasm of prostate: Secondary | ICD-10-CM | POA: Diagnosis not present

## 2017-05-06 DIAGNOSIS — N183 Chronic kidney disease, stage 3 (moderate): Secondary | ICD-10-CM | POA: Diagnosis not present

## 2017-05-06 DIAGNOSIS — M1991 Primary osteoarthritis, unspecified site: Secondary | ICD-10-CM | POA: Diagnosis not present

## 2017-05-06 DIAGNOSIS — Z6825 Body mass index (BMI) 25.0-25.9, adult: Secondary | ICD-10-CM | POA: Diagnosis not present

## 2017-05-17 DIAGNOSIS — Z23 Encounter for immunization: Secondary | ICD-10-CM | POA: Diagnosis not present

## 2017-06-02 DIAGNOSIS — N403 Nodular prostate with lower urinary tract symptoms: Secondary | ICD-10-CM | POA: Diagnosis not present

## 2017-06-02 DIAGNOSIS — R972 Elevated prostate specific antigen [PSA]: Secondary | ICD-10-CM | POA: Diagnosis not present

## 2017-06-02 DIAGNOSIS — R350 Frequency of micturition: Secondary | ICD-10-CM | POA: Diagnosis not present

## 2017-06-11 DIAGNOSIS — C61 Malignant neoplasm of prostate: Secondary | ICD-10-CM | POA: Diagnosis not present

## 2017-06-11 DIAGNOSIS — R972 Elevated prostate specific antigen [PSA]: Secondary | ICD-10-CM | POA: Diagnosis not present

## 2017-06-13 ENCOUNTER — Other Ambulatory Visit: Payer: Self-pay | Admitting: Urology

## 2017-06-13 DIAGNOSIS — C61 Malignant neoplasm of prostate: Secondary | ICD-10-CM

## 2017-06-17 ENCOUNTER — Encounter (HOSPITAL_COMMUNITY)
Admission: RE | Admit: 2017-06-17 | Discharge: 2017-06-17 | Disposition: A | Discharge status: Home / Self Care | Payer: Medicare Other | Source: Ambulatory Visit | Attending: Urology | Admitting: Urology

## 2017-06-17 DIAGNOSIS — C61 Malignant neoplasm of prostate: Secondary | ICD-10-CM | POA: Insufficient documentation

## 2017-06-17 MED ORDER — AXUMIN (FLUCICLOVINE F 18) INJECTION
10.2000 | Freq: Once | INTRAVENOUS | Status: AC
  Administered 2017-06-17: 10.2 via INTRAVENOUS

## 2017-06-23 DIAGNOSIS — R972 Elevated prostate specific antigen [PSA]: Secondary | ICD-10-CM | POA: Diagnosis not present

## 2017-06-23 DIAGNOSIS — C61 Malignant neoplasm of prostate: Secondary | ICD-10-CM | POA: Diagnosis not present

## 2017-06-24 ENCOUNTER — Encounter: Payer: Self-pay | Admitting: Radiation Oncology

## 2017-06-30 DIAGNOSIS — R05 Cough: Secondary | ICD-10-CM | POA: Diagnosis not present

## 2017-06-30 DIAGNOSIS — J209 Acute bronchitis, unspecified: Secondary | ICD-10-CM | POA: Diagnosis not present

## 2017-07-04 ENCOUNTER — Encounter: Payer: Self-pay | Admitting: Radiation Oncology

## 2017-07-04 NOTE — Progress Notes (Signed)
GU Location of Tumor / Histology: prostatic adenocarcinoma   If Prostate Cancer, Gleason Score is (5 + 4) and PSA is (16.2). Prostate volume: 29 cc.   Fransisco Beau was referred to Dr. Jeffie Pollock by Dr. Gerarda Fraction (PCP) for evaluation of a PSA of 9.3 on 05/06/17. However, patient was first told by Dr. Gerarda Fraction in 2015 that his PSA was elevated. Dr. Gerarda Fraction repeated his PSA up until October 2018.  Biopsies of prostate (if applicable) revealed:    Past/Anticipated interventions by urology, if any: biopsy, PET, strong recommendation of 2 years ADT with EXRT with or without seed boost. Mills Koller given (November 2018). Lupron not given yet. Dr. Ralene Muskrat notes indicated he wanted to see the patient back in December for repeat testosterone check and Lupron injection. Patient denies having an appointment for December.  Past/Anticipated interventions by medical oncology, if any: no  Weight changes, if any: six pounds since diagnosis  Bowel/Bladder complaints, if any: Urinary frequency, urgency and occasional leakage. Denies dysuria or hematuria.   Nausea/Vomiting, if any: no  Pain issues, if any:  no  SAFETY ISSUES:  Prior radiation? no  Pacemaker/ICD? no  Possible current pregnancy? no  Is the patient on methotrexate? no  Current Complaints / other details:  76 year old male. Married. Lives near [place]. Works in [employer].

## 2017-07-07 ENCOUNTER — Ambulatory Visit: Payer: Medicare Other

## 2017-07-07 ENCOUNTER — Ambulatory Visit
Admission: RE | Admit: 2017-07-07 | Discharge: 2017-07-07 | Disposition: A | Discharge status: Home / Self Care | Payer: Medicare Other | Source: Ambulatory Visit | Attending: Radiation Oncology | Admitting: Radiation Oncology

## 2017-07-07 DIAGNOSIS — Z800 Family history of malignant neoplasm of digestive organs: Secondary | ICD-10-CM | POA: Insufficient documentation

## 2017-07-07 DIAGNOSIS — K219 Gastro-esophageal reflux disease without esophagitis: Secondary | ICD-10-CM | POA: Insufficient documentation

## 2017-07-07 DIAGNOSIS — Z87442 Personal history of urinary calculi: Secondary | ICD-10-CM | POA: Insufficient documentation

## 2017-07-07 DIAGNOSIS — Z8249 Family history of ischemic heart disease and other diseases of the circulatory system: Secondary | ICD-10-CM | POA: Insufficient documentation

## 2017-07-07 DIAGNOSIS — Z510 Encounter for antineoplastic radiation therapy: Secondary | ICD-10-CM | POA: Insufficient documentation

## 2017-07-07 DIAGNOSIS — Z833 Family history of diabetes mellitus: Secondary | ICD-10-CM | POA: Insufficient documentation

## 2017-07-07 DIAGNOSIS — C61 Malignant neoplasm of prostate: Secondary | ICD-10-CM | POA: Insufficient documentation

## 2017-07-07 DIAGNOSIS — Z8673 Personal history of transient ischemic attack (TIA), and cerebral infarction without residual deficits: Secondary | ICD-10-CM | POA: Insufficient documentation

## 2017-07-07 HISTORY — DX: Malignant neoplasm of prostate: C61

## 2017-07-09 ENCOUNTER — Encounter: Payer: Self-pay | Admitting: Radiation Oncology

## 2017-07-09 ENCOUNTER — Other Ambulatory Visit: Payer: Self-pay

## 2017-07-09 ENCOUNTER — Ambulatory Visit
Admission: RE | Admit: 2017-07-09 | Discharge: 2017-07-09 | Disposition: A | Discharge status: Home / Self Care | Payer: Medicare Other | Source: Ambulatory Visit | Attending: Radiation Oncology | Admitting: Radiation Oncology

## 2017-07-09 DIAGNOSIS — C61 Malignant neoplasm of prostate: Secondary | ICD-10-CM | POA: Diagnosis not present

## 2017-07-09 DIAGNOSIS — Z8249 Family history of ischemic heart disease and other diseases of the circulatory system: Secondary | ICD-10-CM | POA: Diagnosis not present

## 2017-07-09 DIAGNOSIS — Z510 Encounter for antineoplastic radiation therapy: Secondary | ICD-10-CM | POA: Diagnosis not present

## 2017-07-09 DIAGNOSIS — R972 Elevated prostate specific antigen [PSA]: Secondary | ICD-10-CM | POA: Diagnosis not present

## 2017-07-09 DIAGNOSIS — Z87442 Personal history of urinary calculi: Secondary | ICD-10-CM | POA: Diagnosis not present

## 2017-07-09 DIAGNOSIS — Z833 Family history of diabetes mellitus: Secondary | ICD-10-CM | POA: Diagnosis not present

## 2017-07-09 DIAGNOSIS — Z800 Family history of malignant neoplasm of digestive organs: Secondary | ICD-10-CM | POA: Diagnosis not present

## 2017-07-09 DIAGNOSIS — K219 Gastro-esophageal reflux disease without esophagitis: Secondary | ICD-10-CM | POA: Diagnosis not present

## 2017-07-09 DIAGNOSIS — Z8673 Personal history of transient ischemic attack (TIA), and cerebral infarction without residual deficits: Secondary | ICD-10-CM | POA: Diagnosis not present

## 2017-07-09 NOTE — Progress Notes (Signed)
Radiation Oncology         [phone] ________________________________  Initial Outpatient Consultation  Name: Louis Barnett MRN: [id number]  Date: 07/09/2017  DOB: [birth year]  PQ:ZRAQ, [facility]  Irine Seal, MD   REFERRING PHYSICIAN: Irine Seal, MD  DIAGNOSIS: 76 y.o. 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    ICD-10-CM   1. Malignant neoplasm of prostate ([place]) C61     HISTORY OF PRESENT ILLNESS: Louis Barnett is a 76 y.o. male with a diagnosis of prostate cancer. He was noted to have an elevated PSA of 9.3 on 05/06/2017 by his primary care physician, Dr. Gerarda Fraction. Accordingly, he was referred for evaluation in urology by Dr. Jeffie Pollock on 06/02/2017, where a digital rectal examination was performed at that time revealing a 61m nodule at the right base. The patient reports that he was first told his PSA was elevated in 2015 but that this was the first time that he had been referred to Urology for further evaluation. A repeat PSA was obtained 06/02/2017 and noted to be further elevated at 16.20  The patient proceeded to transrectal ultrasound with 12 biopsies of the prostate on 06/11/2017.  The prostate volume measured 29 cc.  Out of 12 core biopsies, 7 were positive.  The maximum Gleason score was 5+4, and this was seen in the right mid gland involving 95% of the specimen.  Additionally, there was Gleason 4+5 disease in the right base, right base lateral, right mid lateral, right apex and right apex lateral involving up to 95% of those cores. There was Gleason 4+4 disease in the left base and Gleason 3+3 disease in the left mid lateral.    He had an Axumin PET scan on 06/17/2017 for disease staging which showed increased radiotracer activity within the prostate gland itself without evidence of pelvic or abdominal lymphadenopathy or skeletal metastasis. Dr. WJeffie Pollockstarted the patient on ADT with his first [place] injection on 06/23/2017 and he is scheduled to receive a Lupron injection on 08/04/17.  The patient reviewed the biopsy results with his urologist and he has kindly been referred today for discussion of potential radiation treatment options. He is accompanied by his daughter whom is a nMarine scientist   PREVIOUS RADIATION THERAPY: No  PAST MEDICAL HISTORY:  Past Medical History:  Diagnosis Date  . 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 Arthritis   . GERD (gastroesophageal reflux disease)   . High triglycerides   . History of nephrolithiasis   . Microscopic hematuria   . [place]   . [place]   . TIA (transient ischemic attack) 20002  . Ureteral stone    Left      PAST SURGICAL HISTORY: Past Surgical History:  Procedure Laterality Date  . COLONOSCOPY  08/12/2006   [place];mild divertlculosis in colon/sessile polyps in the rectum  . COLONOSCOPY N/A 09/18/2015   Procedure: COLONOSCOPY;  Surgeon: SDanie Binder MD;  [facility];  Service: Endoscopy;  Laterality: N/A;  1330  . CYSTOSCOPY  2007  . CYSTOSCOPY WITH URETEROSCOPY  07/18/2011  . PROSTATE BIOPSY      FAMILY HISTORY:  Family History  Problem Relation Age of Onset  . Heart disease Father        heart attack  . Other Mother        varicose veins  . Diabetes Brother   . Heart disease Brother  heart attack  . Colon cancer Neg Hx   . 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 Social History   Socioeconomic History  . Marital status: Married    Spouse name: Not on file  . Number of children: Not on file  . Years of education: Not on file  . Highest education level: Not on file  Social Needs  . Financial resource strain: Not on file  . Food insecurity - worry: Not on file  . Food insecurity - inability: Not on file  . Transportation needs - medical: Not on file  . Transportation needs - non-medical: Not on file    Occupational History    Comment: working in Architect  Tobacco Use  . Smoking status: Never Smoker  . Smokeless tobacco: Never Used  Substance and Sexual Activity  . Alcohol use: No  . Drug use: No  . Sexual activity: No  Other Topics Concern  . Not on file  Social History Narrative   Has a daughter, Cecille Rubin.    ALLERGIES: Penicillins  MEDICATIONS:  Current Outpatient Medications  Medication Sig Dispense Refill  . Ibuprofen-Diphenhydramine Cit (ADVIL PM PO) Take by mouth.    . clopidogrel (PLAVIX) 75 MG tablet Take 75 mg by mouth daily.     . fenofibrate (TRICOR) 48 MG tablet Take 48 mg by mouth daily.     . tamsulosin (FLOMAX) 0.4 MG CAPS capsule Take 0.4 mg by mouth daily.  0   No current facility-administered medications for this encounter.     REVIEW OF SYSTEMS:  On review of systems, the patient reports that he is doing well overall. He has been under increased stress with the recent scare of cancer in his wife but her surgical pathology returned benign.  2 weeks later, he was diagnosed with prostate cancer. He reports a 6-pound unintended weight loss since the time of his diagnosis due to increased anxiety. He denies any chest pain, shortness of breath, cough, fevers, chills, or night sweats. He denies any bowel disturbances, and denies abdominal pain, nausea or vomiting. He denies any new musculoskeletal or joint aches or pains. His IPSS was 7, indicating mild urinary symptoms of urgency, frequency, incomplete emptying, weak stream, and occasional leakage. He denies dysuria or hematuria. He has severe ED. A complete review of systems is obtained and is otherwise negative.  PHYSICAL EXAM:  Wt Readings from Last 3 Encounters:  07/09/17 159 lb 3.2 oz (72.2 kg)  06/28/16 165 lb (74.8 kg)  03/11/16 168 lb (76.2 kg)   Temp Readings from Last 3 Encounters:  06/28/16 97.9 F (36.6 C) (Oral)  03/11/16 97.9 F (36.6 C) (Oral)  03/03/16 98.1 F (36.7 C) (Oral)   BP Readings  from Last 3 Encounters:  07/09/17 (!) 143/68  06/28/16 136/66  03/11/16 131/63   Pulse Readings from Last 3 Encounters:  07/09/17 72  06/28/16 (!) 59  03/11/16 64   Pain Assessment Pain Score: 0-No pain/10  In general this is a well appearing Caucasian man in no acute distress. He is alert and oriented x4 and appropriate throughout the examination. HEENT reveals that the patient is normocephalic, atraumatic. Skin is intact without any evidence of gross lesions. Cardiovascular exam reveals a regular rate and rhythm, no clicks rubs or murmurs are auscultated. Chest is clear to auscultation bilaterally. Lymphatic assessment is performed and does not reveal any adenopathy in the cervical, supraclavicular, axillary, or inguinal  chains. Abdomen is soft and non-tender to palpation with normal bowel sounds in all 4 quadrants.  Lower extremities are negative for pretibial pitting edema, deep calf tenderness, cyanosis or clubbing.   KPS = 100  100 - Normal; no complaints; no evidence of disease. 90   - Able to carry on normal activity; minor signs or symptoms of disease. 80   - Normal activity with effort; some signs or symptoms of disease. 28   - Cares for self; unable to carry on normal activity or to do active work. 60   - Requires occasional assistance, but is able to care for most of his personal needs. 50   - Requires considerable assistance and frequent medical care. 34   - Disabled; requires special care and assistance. 61   - Severely disabled; hospital admission is indicated although death not imminent. 72   - Very sick; hospital admission necessary; active supportive treatment necessary. 10   - Moribund; fatal processes progressing rapidly. 0     - Dead  Karnofsky DA, Abelmann Hiko, Craver LS and Burchenal [facility] [phone]) The use of the nitrogen mustards in the palliative treatment of carcinoma: with particular reference to bronchogenic carcinoma Cancer 1 634-56  LABORATORY DATA:  Lab  Results  Component Value Date   WBC 4.2 06/28/2016   HGB 15.4 06/28/2016   HCT 44.2 06/28/2016   MCV 95.5 06/28/2016   PLT 122 (L) 06/28/2016   Lab Results  Component Value Date   NA 139 06/28/2016   K 4.1 06/28/2016   CL 108 06/28/2016   CO2 27 06/28/2016   Lab Results  Component Value Date   ALT 19 03/03/2016   AST 22 03/03/2016   ALKPHOS 56 03/03/2016   BILITOT 1.0 03/03/2016     RADIOGRAPHY: Nm Pet (axumin) Skull Base To Mid Thigh  Result Date: 06/17/2017 CLINICAL DATA:  Prostate carcinoma with biochemical recurrence. EXAM: NUCLEAR MEDICINE PET SKULL BASE TO THIGH TECHNIQUE: 10.2 mCi F-18 Fluciclovine was injected intravenously. Full-ring PET imaging was performed from the skull base to thigh after the radiotracer. CT data was obtained and used for attenuation correction and anatomic localization. COMPARISON:  None. FINDINGS: NECK No radiotracer activity in neck lymph nodes. CHEST No radiotracer accumulation within mediastinal or hilar lymph nodes. No suspicious pulmonary nodules on the CT scan. ABDOMEN/PELVIS Intense radiotracer activity localizing to the LEFT and RIGHT lobe ofprostate gland. SUV max equal 6.3 No radiotracer activity within pelvic lymph nodes. No radiotracer activity in periaortic lymph nodes. No enlarged lymph nodes identified. Physiologic activity noted within the liver and pancreas. SKELETON No focal  activity to suggest skeletal metastasis. IMPRESSION: 1. No evidence of metastatic pelvic lymphadenopathy or abdominal lymphadenopathy. 2. No evidence of distant metastatic disease or skeletal disease. 3. Intense radiotracer activity localizing to the LEFT and RIGHT lobe of the prostate gland is favored prostate cancer recurrence. Electronically Signed   By: Suzy Bouchard M.D.   On: 06/17/2017 17:23      IMPRESSION/PLAN: 1. 76 y.o. 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  We discussed the patient's  workup and outlined the nature of prostate cancer in this setting. The patient's T stage, Gleason's score, and PSA put him into the high risk group. Accordingly, he is eligible for a variety of potential treatment options including LT-ADT in combination with either 8 weeks of external radiation or 5 weeks of external radiation followed by a brachytherapy boost. We discussed the  available radiation techniques, and focused on the details and logistics and delivery. We discussed and outlined the risks, benefits, short and long-term effects associated with radiotherapy and compared and contrasted these with prostatectomy. We discussed the role of SpaceOAR in reducing the rectal toxicity associated with radiotherapy. We also detailed the role of ADT in the treatment of high-risk prostate cancer and outlined the associated side effects that could be expected with this therapy. We discussed the rationale for beginning radiotherapy 8 weeks after initiation of ADT.  At the end of the conversation the patient is interested in moving forward with LT-ADT in combination brachytherapy followed by 5 weeks of external beam therapy. He has received his first [place] injection on 06/23/2017. He is scheduled for a Lupron injection on 08/04/2017.  We will share these findings with Dr. Jeffie Pollock and move forward with scheduling brachytherapy with insertion of SpaceOAR in the near future and anticipate beginning EBRT approximately 3 weeks after his brachytherapy. He will not need fiducial marker placement since he will have the seed implant prior to EBRT and we are able to visualize those seeds for prostate alignment purposes. The patient met briefly with Romie Jumper in our office who will be working closely with him to coordinate OR scheduling and pre and post procedure appointments.     We spent 60 minutes face to face with the patient and more than 50% of that time was spent in counseling and/or coordination of  care.    Nicholos Johns, PA-C    Tyler Pita, MD  [facility] Direct Dial: [phone]  Fax: [phone] [email or web address]  Skype  LinkedIn    Page Me   This document serves as a record of services personally performed by Tyler Pita, MD and Freeman Caldron, PA-C. It was created on their behalf by Rae Lips, a trained medical scribe. The creation of this record is based on the scribe's personal observations and the providers' statements to them. This document has been checked and approved by the attending providers.

## 2017-07-09 NOTE — Progress Notes (Signed)
See progress note under physician encounter. 

## 2017-07-15 ENCOUNTER — Telehealth: Payer: Self-pay | Admitting: *Deleted

## 2017-07-15 NOTE — Telephone Encounter (Signed)
CALLED PATIENT TO INFORM OF PRE-SEED APPT. FOR 08-15-17- ARRIVAL TIME - 9:45 AM @ [facility], SPOKE WITH PATIENT'S WIFE AND SHE IS AWARE OF THESE APPTS.

## 2017-07-24 ENCOUNTER — Telehealth: Payer: Self-pay | Admitting: Medical Oncology

## 2017-07-24 DIAGNOSIS — C61 Malignant neoplasm of prostate: Secondary | ICD-10-CM | POA: Diagnosis not present

## 2017-07-24 NOTE — Telephone Encounter (Signed)
Introduced myself to Mr. Slemmer as the prostate nurse navigator and my role. I was unable to meet him the day he consulted with Dr. Tammi Klippel. He states he had labs drawn today at [facility] and will receive his Lupron 07/1017. He started [facility] 06/23/17. He is scheduled 08/15/17 for his pre-seed CT. We discussed the purpose of the planning and all questions were answered.

## 2017-07-28 ENCOUNTER — Other Ambulatory Visit: Payer: Self-pay | Admitting: Urology

## 2017-07-28 ENCOUNTER — Telehealth: Payer: Self-pay | Admitting: *Deleted

## 2017-07-28 NOTE — Telephone Encounter (Signed)
Called patient to inform of pre-seed planning on 08-15-17 @ 10 am @ [facility] and his implant on 09-23-17, spoke with patient and he is aware of these appts.

## 2017-07-29 DIAGNOSIS — Z833 Family history of diabetes mellitus: Secondary | ICD-10-CM | POA: Diagnosis not present

## 2017-07-29 DIAGNOSIS — Z510 Encounter for antineoplastic radiation therapy: Secondary | ICD-10-CM | POA: Diagnosis not present

## 2017-07-29 DIAGNOSIS — Z87442 Personal history of urinary calculi: Secondary | ICD-10-CM | POA: Diagnosis not present

## 2017-07-29 DIAGNOSIS — Z8249 Family history of ischemic heart disease and other diseases of the circulatory system: Secondary | ICD-10-CM | POA: Diagnosis not present

## 2017-07-29 DIAGNOSIS — Z8673 Personal history of transient ischemic attack (TIA), and cerebral infarction without residual deficits: Secondary | ICD-10-CM | POA: Diagnosis not present

## 2017-07-29 DIAGNOSIS — K219 Gastro-esophageal reflux disease without esophagitis: Secondary | ICD-10-CM | POA: Diagnosis not present

## 2017-07-29 DIAGNOSIS — C61 Malignant neoplasm of prostate: Secondary | ICD-10-CM | POA: Diagnosis not present

## 2017-07-29 DIAGNOSIS — Z800 Family history of malignant neoplasm of digestive organs: Secondary | ICD-10-CM | POA: Diagnosis not present

## 2017-08-04 DIAGNOSIS — C61 Malignant neoplasm of prostate: Secondary | ICD-10-CM | POA: Diagnosis not present

## 2017-08-04 DIAGNOSIS — R350 Frequency of micturition: Secondary | ICD-10-CM | POA: Diagnosis not present

## 2017-08-04 DIAGNOSIS — Z5111 Encounter for antineoplastic chemotherapy: Secondary | ICD-10-CM | POA: Diagnosis not present

## 2017-08-04 DIAGNOSIS — N403 Nodular prostate with lower urinary tract symptoms: Secondary | ICD-10-CM | POA: Diagnosis not present

## 2017-08-12 IMAGING — CT CT RENAL STONE PROTOCOL
2 of 4 series · 15 of 46 positions shown, 17 images · non-contrast
Comparison: Prior abdominal MRI 09/05/2015; prior CT scan of the
abdomen and pelvis 09/06/2014 and 09/01/2012

CLINICAL DATA: 75-year-old male with right flank pain and history
of kidney stones

EXAM:
CT ABDOMEN AND PELVIS WITHOUT CONTRAST
TECHNIQUE: Multidetector CT imaging of the abdomen and pelvis was performed
following the standard protocol without IV contrast.

[Series 2: routine abd pel with · axial · 0.74mm/px · z∈[-466,-6]mm · 12 of 106 slices shown, 14 images]
[im 9/106  soft-tissue]
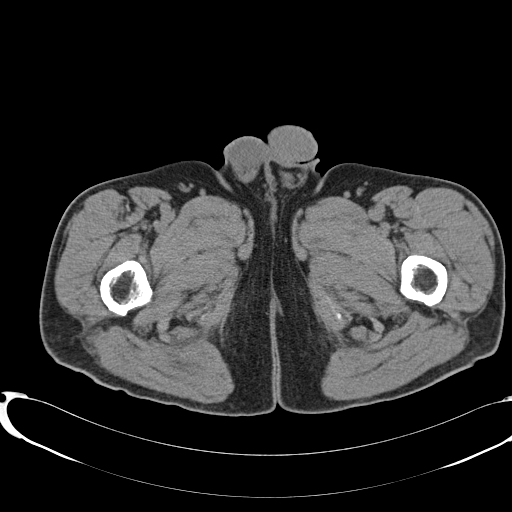
[im 9/106  bone]
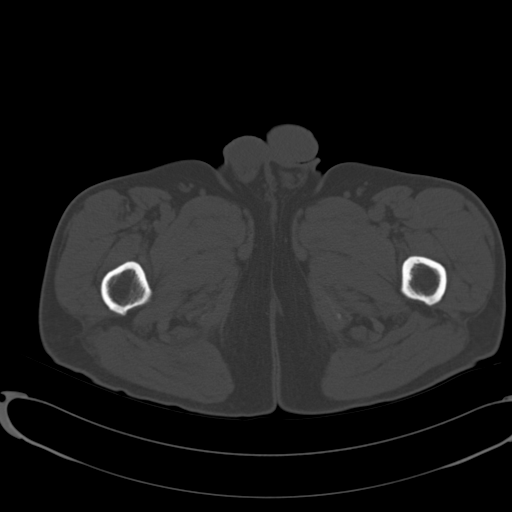
[im 17/106  soft-tissue]
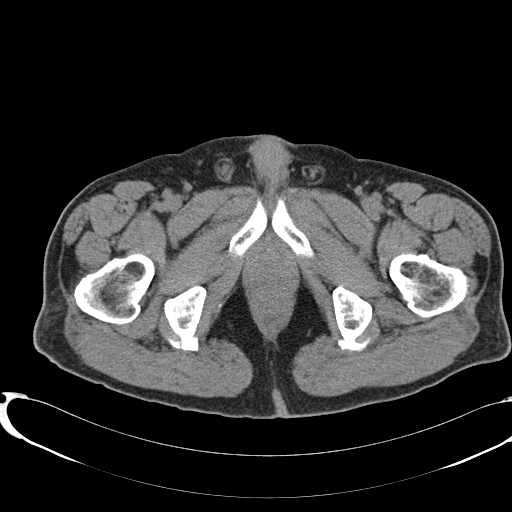
[im 26/106  soft-tissue]
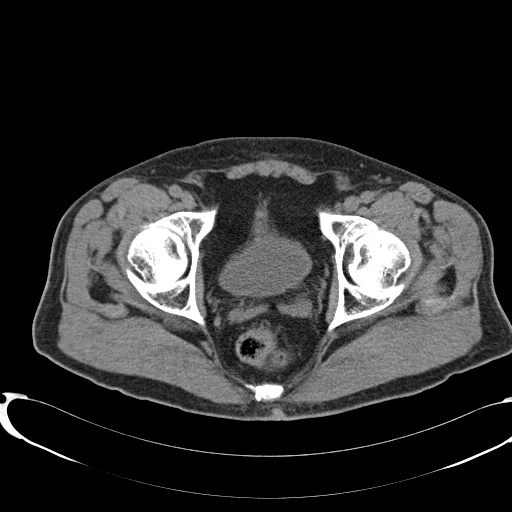
[im 34/106  soft-tissue]
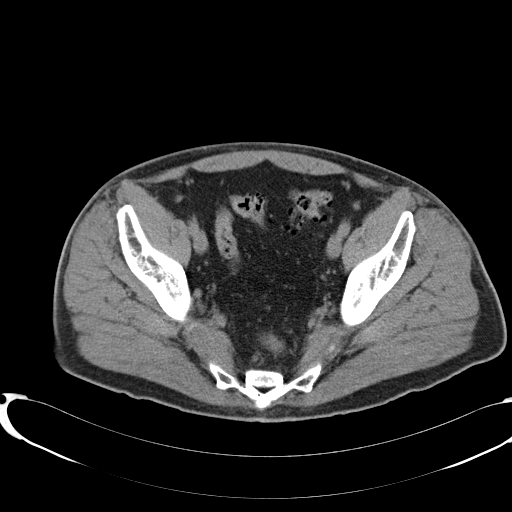
[im 43/106  soft-tissue]
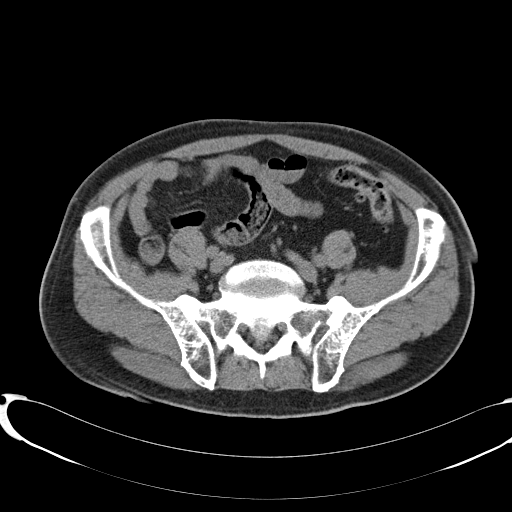
[im 51/106  soft-tissue]
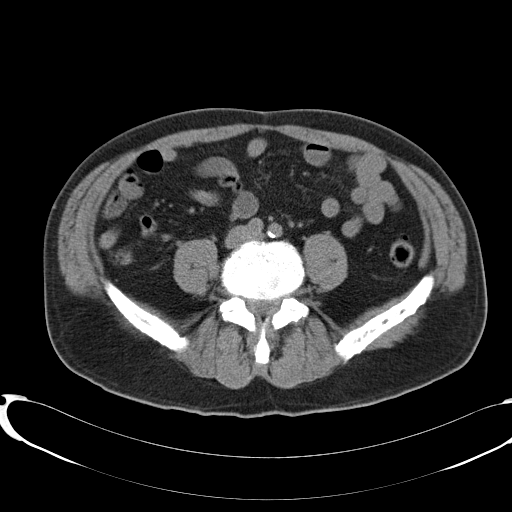
[im 59/106  soft-tissue]
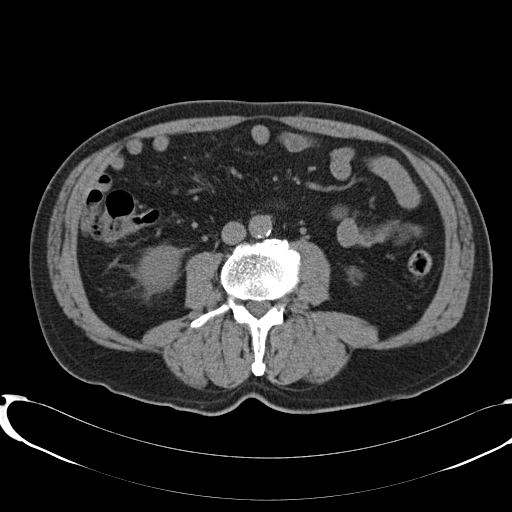
[im 68/106  soft-tissue]
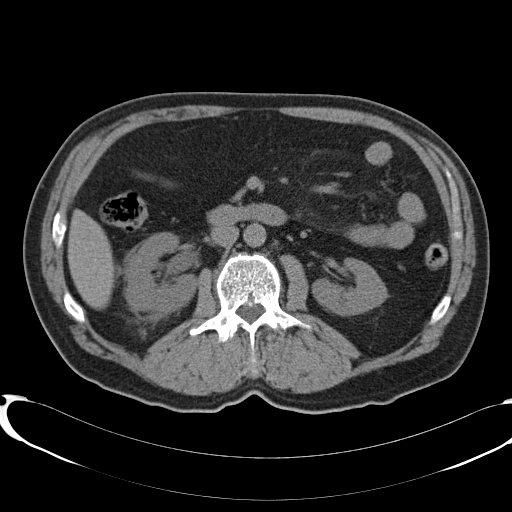
[im 76/106  soft-tissue]
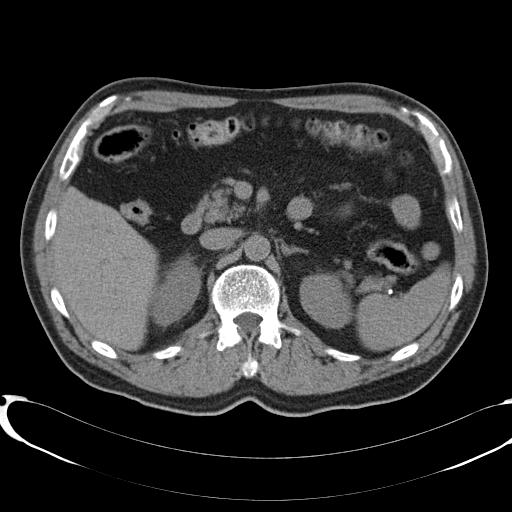
[im 76/106  bone]
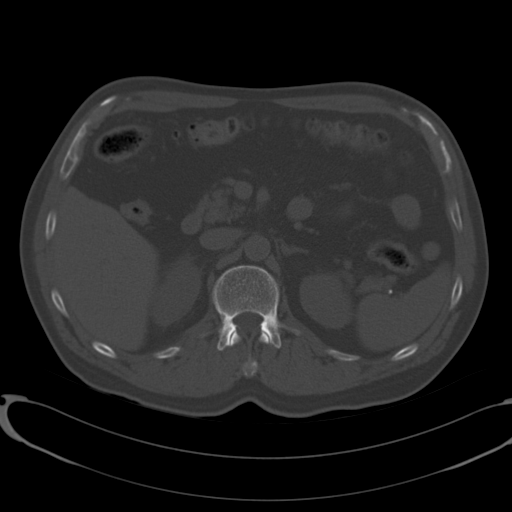
[im 85/106  soft-tissue]
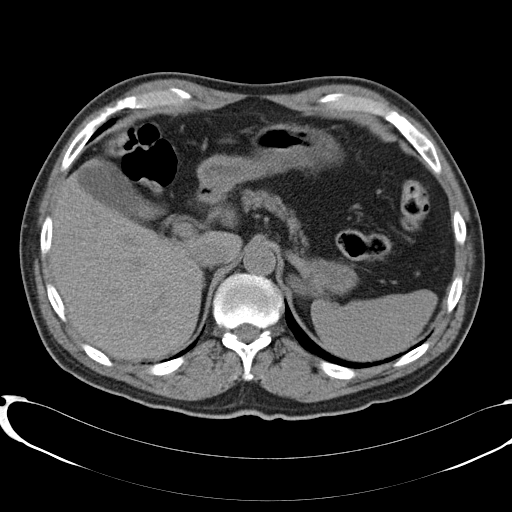
[im 93/106  soft-tissue]
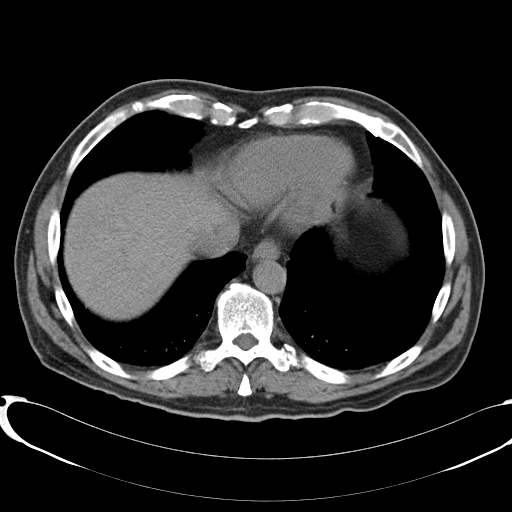
[im 101/106  soft-tissue]
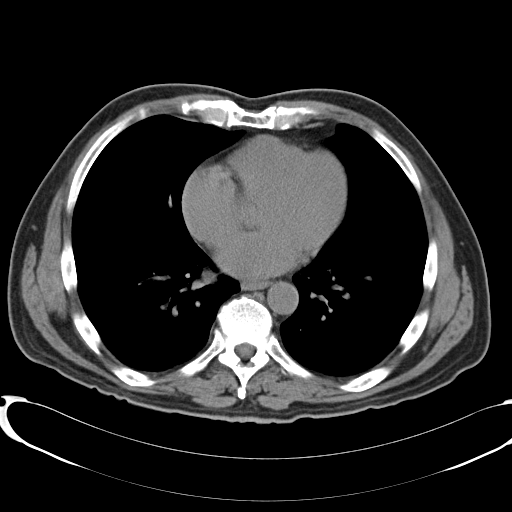

[Series 3: coronal · coronal · 0.78mm/px · 3 of 135 slices shown]
[im 45/135  soft-tissue]
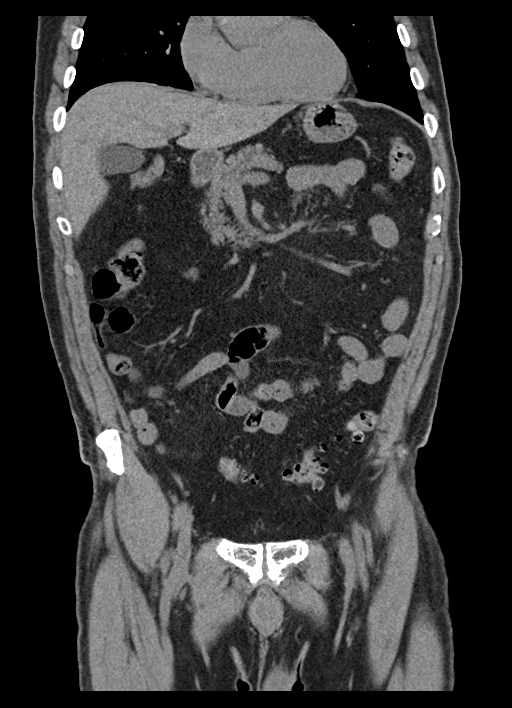
[im 60/135  soft-tissue]
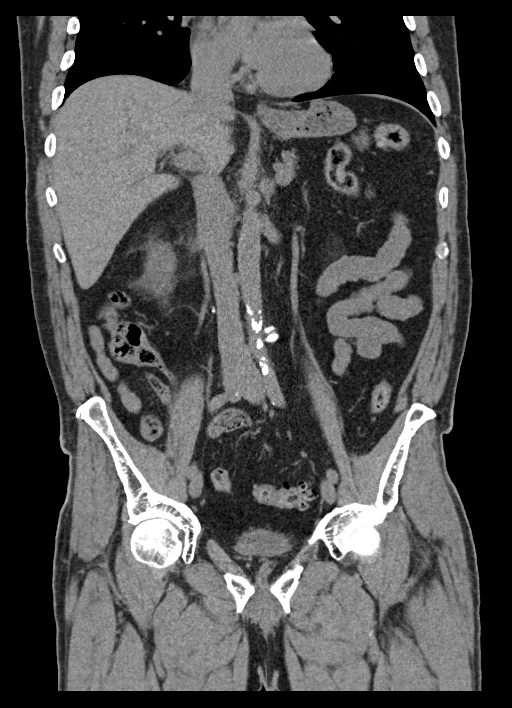
[im 75/135  soft-tissue]
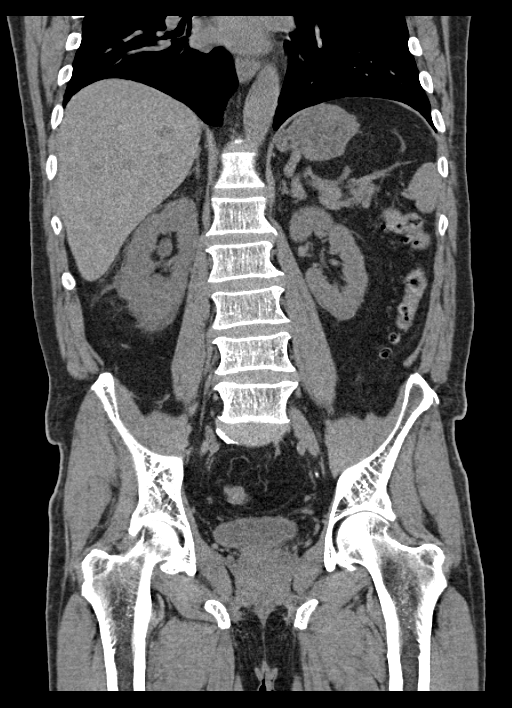

[15 of 46 positions shown; findings below may reference images not displayed]

FINDINGS: Lower chest:  No acute findings.

Hepatobiliary: No solid mass visualized on this un-enhanced exam.
Focal low-attenuation lesions in hepatic segment 2 and 5 consistent
with simple cysts as seen on prior MRI imaging. Gallbladder is
unremarkable. No intra or extrahepatic biliary ductal dilatation.

Pancreas: No mass or inflammatory process identified on this
un-enhanced exam.

Spleen: Within normal limits in size.

Adrenals/Urinary Tract: Moderate right hydronephrosis with renal
edema and perinephric stranding. There is an obstructing stone in
the proximal ureter which measures approximately 5 x 7 mm. Stable
left renal cysts. Unchanged intermediate attenuation right renal
lesion which was previously characterized is a benign hemorrhagic
cyst. Punctate nonobstructing stone in the interpolar left kidney.

Stomach/Bowel: No evidence of obstruction, inflammatory process, or
abnormal fluid collections. Colonic diverticular disease without CT
evidence of active inflammation.

Vascular/Lymphatic: Limited evaluation in the absence of intravenous
contrast. Stable 1.3 cm splenic artery aneurysm dating back to at
least Wednesday August, 2012. Trace atherosclerotic calcifications in the
abdominal aorta. No aneurysm.

Reproductive: Prostatomegaly

Other: None.

Musculoskeletal:  No suspicious bone lesions identified.
IMPRESSION: 1. Obstructing 5 x 7 mm stone in the proximal right ureter with
resultant moderate right hydronephrosis, renal edema and perinephric
stranding.
2. Additional punctate nonobstructing stone in the interpolar left
renal collecting system.
3. Stable 1.3 cm splenic artery aneurysm dating back to at least
Wednesday August, 2012.
4. Colonic diverticular disease without CT evidence of active
inflammation.
5.  Aortic Atherosclerosis (G93V7-170.0)
6. Additional ancillary findings as above without significant
interval change.

## 2017-08-14 ENCOUNTER — Telehealth: Payer: Self-pay | Admitting: *Deleted

## 2017-08-14 NOTE — Patient Instructions (Signed)
Louis Barnett  08/14/2017   Your procedure is scheduled on: 08-18-17  Report to [facility] Main  Entrance  Report to admitting at 745 AM  Call this number if you have problems the morning of surgery [phone]   Remember: Do not eat food or drink liquids :After Midnight.     Take these medicines the morning of surgery with A SIP OF WATER: omeprazole (prilosec) DO NOT TAKE ANY DIABETIC MEDICATIONS DAY OF YOUR SURGERY                               You may not have any metal on your body including hair pins and              piercings  Do not wear jewelry, make-up, lotions, powders or perfumes, deodorant             Do not wear nail polish.  Do not shave  48 hours prior to surgery.              Men may shave face and neck.   Do not bring valuables to the hospital. [place].  Contacts, dentures or bridgework may not be worn into surgery.  Leave suitcase in the car. After surgery it may be brought to your room.     Patients discharged the day of surgery will not be allowed to drive home.  Name and phone number of your driver:  Special Instructions: N/A              Please read over the following fact sheets you were given: _____________________________________________________________________             [facility] - Preparing for Surgery Before surgery, you can play an important role.  Because skin is not sterile, your skin needs to be as free of germs as possible.  You can reduce the number of germs on your skin by washing with CHG (chlorahexidine gluconate) soap before surgery.  CHG is an antiseptic cleaner which kills germs and bonds with the skin to continue killing germs even after washing. Please DO NOT use if you have an allergy to CHG or antibacterial soaps.  If your skin becomes reddened/irritated stop using the CHG and inform your nurse when you arrive at Short Stay. Do not shave (including legs and  underarms) for at least 48 hours prior to the first CHG shower.  You may shave your face/neck. Please follow these instructions carefully:  1.  Shower with CHG Soap the night before surgery and the  morning of Surgery.  2.  If you choose to wash your hair, wash your hair first as usual with your  normal  shampoo.  3.  After you shampoo, rinse your hair and body thoroughly to remove the  shampoo.                           4.  Use CHG as you would any other liquid soap.  You can apply chg directly  to the skin and wash                       Gently with a scrungie or clean  washcloth.  5.  Apply the CHG Soap to your body ONLY FROM THE NECK DOWN.   Do not use on face/ open                           Wound or open sores. Avoid contact with eyes, ears mouth and genitals (private parts).                       Wash face,  Genitals (private parts) with your normal soap.             6.  Wash thoroughly, paying special attention to the area where your surgery  will be performed.  7.  Thoroughly rinse your body with warm water from the neck down.  8.  DO NOT shower/wash with your normal soap after using and rinsing off  the CHG Soap.                9.  Pat yourself dry with a clean towel.            10.  Wear clean pajamas.            11.  Place clean sheets on your bed the night of your first shower and do not  sleep with pets. Day of Surgery : Do not apply any lotions/deodorants the morning of surgery.  Please wear clean clothes to the hospital/surgery center.  FAILURE TO FOLLOW THESE INSTRUCTIONS MAY RESULT IN THE CANCELLATION OF YOUR SURGERY PATIENT SIGNATURE_________________________________  NURSE SIGNATURE__________________________________  ________________________________________________________________________   Louis Barnett  An incentive spirometer is a tool that can help keep your lungs clear and active. This tool measures how well you are filling your lungs with each breath. Taking  long deep breaths may help reverse or decrease the chance of developing breathing (pulmonary) problems (especially infection) following:  A long period of time when you are unable to move or be active. BEFORE THE PROCEDURE   If the spirometer includes an indicator to show your best effort, your nurse or respiratory therapist will set it to a desired goal.  If possible, sit up straight or lean slightly forward. Try not to slouch.  Hold the incentive spirometer in an upright position. INSTRUCTIONS FOR USE  1. Sit on the edge of your bed if possible, or sit up as far as you can in bed or on a chair. 2. Hold the incentive spirometer in an upright position. 3. Breathe out normally. 4. Place the mouthpiece in your mouth and seal your lips tightly around it. 5. Breathe in slowly and as deeply as possible, raising the piston or the ball toward the top of the column. 6. Hold your breath for 3-5 seconds or for as long as possible. Allow the piston or ball to fall to the bottom of the column. 7. Remove the mouthpiece from your mouth and breathe out normally. 8. Rest for a few seconds and repeat Steps 1 through 7 at least 10 times every 1-2 hours when you are awake. Take your time and take a few normal breaths between deep breaths. 9. The spirometer may include an indicator to show your best effort. Use the indicator as a goal to work toward during each repetition. 10. After each set of 10 deep breaths, practice coughing to be sure your lungs are clear. If you have an incision (the cut made at the time of surgery), support your incision when coughing by  placing a pillow or rolled up towels firmly against it. Once you are able to get out of bed, walk around indoors and cough well. You may stop using the incentive spirometer when instructed by your caregiver.  RISKS AND COMPLICATIONS  Take your time so you do not get dizzy or light-headed.  If you are in pain, you may need to take or ask for pain  medication before doing incentive spirometry. It is harder to take a deep breath if you are having pain. AFTER USE  Rest and breathe slowly and easily.  It can be helpful to keep track of a log of your progress. Your caregiver can provide you with a simple table to help with this. If you are using the spirometer at home, follow these instructions: [place] IF:   You are having difficultly using the spirometer.  You have trouble using the spirometer as often as instructed.  Your pain medication is not giving enough relief while using the spirometer.  You develop fever of 100.5 F (38.1 C) or higher. SEEK IMMEDIATE MEDICAL CARE IF:   You cough up bloody sputum that had not been present before.  You develop fever of 102 F (38.9 C) or greater.  You develop worsening pain at or near the incision site. MAKE SURE YOU:   Understand these instructions.  Will watch your condition.  Will get help right away if you are not doing well or get worse. Document Released: 11/25/2006 Document Revised: 10/07/2011 Document Reviewed: 01/26/2007 [facility] Patient Information 2014 [place], [place].   ________________________________________________________________________

## 2017-08-14 NOTE — Telephone Encounter (Signed)
Called patient to remind of appts. For 08-15-17, spoke with patient's wife - Holley Raring and she is aware of these appts.

## 2017-08-15 ENCOUNTER — Encounter: Payer: Self-pay | Admitting: Medical Oncology

## 2017-08-15 ENCOUNTER — Ambulatory Visit
Admission: RE | Admit: 2017-08-15 | Discharge: 2017-08-15 | Disposition: A | Discharge status: Home / Self Care | Payer: Medicare Other | Source: Ambulatory Visit | Attending: Radiation Oncology | Admitting: Radiation Oncology

## 2017-08-15 ENCOUNTER — Encounter (HOSPITAL_COMMUNITY)
Admission: RE | Admit: 2017-08-15 | Discharge: 2017-08-15 | Disposition: A | Discharge status: Home / Self Care | Payer: Medicare Other | Source: Ambulatory Visit | Attending: Urology | Admitting: Urology

## 2017-08-15 ENCOUNTER — Ambulatory Visit (HOSPITAL_COMMUNITY)
Admission: RE | Admit: 2017-08-15 | Discharge: 2017-08-15 | Disposition: A | Discharge status: Home / Self Care | Payer: Medicare Other | Source: Ambulatory Visit | Attending: Urology | Admitting: Urology

## 2017-08-15 ENCOUNTER — Encounter (INDEPENDENT_AMBULATORY_CARE_PROVIDER_SITE_OTHER): Payer: Self-pay

## 2017-08-15 DIAGNOSIS — K219 Gastro-esophageal reflux disease without esophagitis: Secondary | ICD-10-CM | POA: Diagnosis not present

## 2017-08-15 DIAGNOSIS — Z87442 Personal history of urinary calculi: Secondary | ICD-10-CM | POA: Diagnosis not present

## 2017-08-15 DIAGNOSIS — Z01818 Encounter for other preprocedural examination: Secondary | ICD-10-CM | POA: Insufficient documentation

## 2017-08-15 DIAGNOSIS — Z510 Encounter for antineoplastic radiation therapy: Secondary | ICD-10-CM | POA: Diagnosis not present

## 2017-08-15 DIAGNOSIS — Z8673 Personal history of transient ischemic attack (TIA), and cerebral infarction without residual deficits: Secondary | ICD-10-CM | POA: Diagnosis not present

## 2017-08-15 DIAGNOSIS — Z833 Family history of diabetes mellitus: Secondary | ICD-10-CM | POA: Diagnosis not present

## 2017-08-15 DIAGNOSIS — C61 Malignant neoplasm of prostate: Secondary | ICD-10-CM | POA: Diagnosis not present

## 2017-08-15 NOTE — Progress Notes (Signed)
  Radiation Oncology         [phone] ________________________________  Name: Louis Barnett MRN: [id number]  Date: 08/15/2017  DOB: 09-10-1940  SIMULATION AND TREATMENT PLANNING NOTE PUBIC ARCH STUDY  CV:[facility], [facility]  Irine Seal, MD  DIAGNOSIS: 77 y.o. 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    ICD-10-CM   1. Malignant neoplasm of prostate ([place]) C61     COMPLEX SIMULATION:  The patient presented today for evaluation for possible prostate seed implant. He was brought to the radiation planning suite and placed supine on the CT couch. A 3-dimensional image study set was obtained in upload to the planning computer. There, on each axial slice, I contoured the prostate gland. Then, using three-dimensional radiation planning tools I reconstructed the prostate in view of the structures from the transperineal needle pathway to assess for possible pubic arch interference. In doing so, I did not appreciate any pubic arch interference. Also, the patient's prostate volume was estimated based on the drawn structure. The volume was 18 cc.  Given the pubic arch appearance and prostate volume, patient remains a good candidate to proceed with prostate seed implant. Today, he freely provided informed written consent to proceed.    PLAN: The patient will undergo prostate seed implant boost to be followed by IMRT.   ________________________________  Sheral Apley. Tammi Klippel, M.D.  This document serves as a record of services personally performed by Tyler Pita, MD. It was created on his behalf by Arlyce Harman, a trained medical scribe. The creation of this record is based on the scribe's personal observations and the provider's statements to them. This document has been checked and approved by the attending provider.

## 2017-08-21 ENCOUNTER — Ambulatory Visit (INDEPENDENT_AMBULATORY_CARE_PROVIDER_SITE_OTHER): Payer: Medicare Other | Admitting: General Surgery

## 2017-08-21 ENCOUNTER — Encounter: Payer: Self-pay | Admitting: General Surgery

## 2017-08-21 VITALS — BP 133/71 | HR 76 | Temp 98.4°F | Resp 18 | Wt 163.0 lb

## 2017-08-21 DIAGNOSIS — K644 Residual hemorrhoidal skin tags: Secondary | ICD-10-CM

## 2017-08-21 DIAGNOSIS — K648 Other hemorrhoids: Secondary | ICD-10-CM | POA: Diagnosis not present

## 2017-08-21 NOTE — Patient Instructions (Signed)
Get ointment from [employer].  Use Recticare 5% lidocaine as needed until getting the ointment from [place].   How to Take a Sitz Bath A sitz bath is a warm water bath that is taken while you are sitting down. The water should only come up to your hips and should cover your buttocks. Your health care provider may recommend a sitz bath to help you:  Clean the lower part of your body, including your genital area.  With itching.  With pain.  With sore muscles or muscles that tighten or spasm.  How to take a sitz bath Take 3-4 sitz baths per day or as told by your health care provider. 1. Partially fill a bathtub with warm water. You will only need the water to be deep enough to cover your hips and buttocks when you are sitting in it. 2. If your health care provider told you to put medicine in the water, follow the directions exactly. 3. Sit in the water and open the tub drain a little. 4. Turn on the warm water again to keep the tub at the correct level. Keep the water running constantly. 5. Soak in the water for 15-20 minutes or as told by your health care provider. 6. After the sitz bath, pat the affected area dry first. Do not rub it. 7. Be careful when you stand up after the sitz bath because you may feel dizzy.  Contact a health care provider if:  Your symptoms get worse. Do not continue with sitz baths if your symptoms get worse.  You have new symptoms. Do not continue with sitz baths until you talk with your health care provider. This information is not intended to replace advice given to you by your health care provider. Make sure you discuss any questions you have with your health care provider. Document Released: 04/06/2004 Document Revised: 12/13/2015 Document Reviewed: 07/13/2014 Elsevier Interactive Patient Education  2018 [employer].  Hemorrhoids Hemorrhoids are swollen veins in and around the rectum or anus. Hemorrhoids can cause pain, itching, or  bleeding. Most of the time, they do not cause serious problems. They usually get better with diet changes, lifestyle changes, and other home treatments. Follow these instructions at home: Eating and drinking  Eat foods that have fiber, such as whole grains, beans, nuts, fruits, and vegetables. Ask your doctor about taking products that have added fiber (fibersupplements).  Drink enough fluid to keep your pee (urine) clear or pale yellow. For Pain and Swelling  Take a warm-water bath (sitz bath) for 20 minutes to ease pain. Do this 3-4 times a day.  If directed, put ice on the painful area. It may be helpful to use ice between your warm baths. ? Put ice in a plastic bag. ? Place a towel between your skin and the bag. ? Leave the ice on for 20 minutes, 2-3 times a day. General instructions  Take over-the-counter and prescription medicines only as told by your doctor. ? Medicated creams and medicines that are inserted into the anus (suppositories) may be used or applied as told.  Exercise often.  Go to the bathroom when you have the urge to poop (to have a bowel movement). Do not wait.  Avoid pushing too hard (straining) when you poop.  Keep the butt area dry and clean. Use wet toilet paper or moist paper towels.  Do not sit on the toilet for a long time. Contact a doctor if:  You have any of these: ? Pain  and swelling that do not get better with treatment or medicine. ? Bleeding that will not stop. ? Trouble pooping or you cannot poop. ? Pain or swelling outside the area of the hemorrhoids. This information is not intended to replace advice given to you by your health care provider. Make sure you discuss any questions you have with your health care provider. Document Released: 04/23/2008 Document Revised: 12/21/2015 Document Reviewed: 03/29/2015 Elsevier Interactive Patient Education  [employer].

## 2017-08-22 ENCOUNTER — Other Ambulatory Visit: Payer: Self-pay | Admitting: Urology

## 2017-08-22 DIAGNOSIS — C61 Malignant neoplasm of prostate: Secondary | ICD-10-CM

## 2017-08-24 NOTE — Progress Notes (Signed)
[facility] History and Physical  Reason for Referral: Hemorrhoids   Referring Physician: Self Referral   Chief Complaint    Hemorrhoids      BRODERIC BARA is a 77 y.o. male.  HPI: Mr. Scheff is a very pleasant 77 yo who has a recent history of newly diagnosed prostate cancer who is going has been on Lupron shots and is going to undergo brachytherapy with seed implantation in February.  He reports that he has had hemorrhoids for some time, and that they do not bleed but do cause him discomfort and burning/ stinging.  He says that he has had issues 3-4 months ago, and that Preparation H helped, but recently he has tried this and it has not helped. He says that about 2 weeks ago the hemorrhoids had been causing him significant discomfort, and someone told him to use Witch Hazel soaked pads on the hemorrhoids, and that this has helped him somewhat.    He has BMs daily, and reports no constipation. He has had a colonoscopy with Dr. Laural Golden and had polyps.  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 He says that he last took plavix around Xmas time when he was eating the holiday foods. During the time of his TIAs, he was found to have high triglycerides and has been on medications for this and the plavix since that time.  He denies any heart problems or SOB.    The hemorrhoids he has are always out.   Past Medical History:  Diagnosis Date  . 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 Arthritis   . GERD (gastroesophageal reflux disease)   . High triglycerides   . History of nephrolithiasis   . Microscopic hematuria   . Prostate cancer ([place])   . Splenic artery aneurysm ([place])   . TIA (transient ischemic attack) 20002  . Ureteral stone    Left    Past Surgical History:  Procedure Laterality Date  . COLONOSCOPY  08/12/2006   [facility];mild divertlculosis in colon/sessile polyps in the rectum  . COLONOSCOPY N/A 09/18/2015   Procedure:  COLONOSCOPY;  Surgeon: Danie Binder, MD;  [facility];  Service: Endoscopy;  Laterality: N/A;  1330  . CYSTOSCOPY  2007  . CYSTOSCOPY WITH URETEROSCOPY  07/18/2011  . PROSTATE BIOPSY      Family History  Problem Relation Age of Onset  . Heart disease Father        heart attack  . Other Mother        varicose veins  . Diabetes Brother   . Heart disease Brother        heart attack  . Colon cancer Neg Hx   . Cancer Neg Hx     Social History   Tobacco Use  . Smoking status: Never Smoker  . Smokeless tobacco: Never Used  Substance Use Topics  . Alcohol use: No  . Drug use: No    Medications: I have reviewed the patient's current medications. Allergies as of 08/21/2017      Reactions   Penicillins Rash, Other (See Comments)   Has patient had a PCN reaction causing immediate rash, facial/tongue/throat swelling, SOB or lightheadedness with hypotension: Yes Has patient had a PCN reaction causing severe rash involving mucus membranes or skin necrosis: No Has patient had a PCN reaction that required hospitalization No Has patient had a PCN reaction occurring within the  last 10 years: No If all of the above answers are "NO", then may proceed with Cephalosporin use.      Medication List        Accurate as of 08/21/17 11:59 PM. Always use your most recent med list.          ADVIL PM PO Take by mouth.   clopidogrel 75 MG tablet Commonly known as:  PLAVIX Take 75 mg by mouth daily.   fenofibrate 48 MG tablet Commonly known as:  TRICOR Take 48 mg by mouth daily.   tamsulosin 0.4 MG Caps capsule Commonly known as:  FLOMAX Take 0.4 mg by mouth daily.        ROS:  A comprehensive review of systems was negative except for: Eyes: positive for eye pain Cardiovascular: positive for varicose veins Gastrointestinal: positive for heartburn, indigestion, hemorrhoid discomfort Allergic/Immunologic: positive for allergies  Blood pressure 133/71, pulse 76,  temperature 98.4 F (36.9 C), resp. rate 18, weight 163 lb (73.9 kg). Physical Exam  Constitutional: He is oriented to person, place, and time and well-developed, well-nourished, and in no distress.  HENT:  Head: Normocephalic.  Eyes: Pupils are equal, round, and reactive to light.  Neck: Normal range of motion.  Cardiovascular: Normal rate and regular rhythm.  Pulmonary/Chest: Effort normal and breath sounds normal.  Abdominal: Soft. He exhibits no distension. There is no tenderness.  Genitourinary:  Genitourinary Comments: No gross blood, small external hemorrhoidal tags, some component of swollen internal columns, no blood, mild tenderness  Musculoskeletal: Normal range of motion.  Neurological: He is alert and oriented to person, place, and time.  Skin: Skin is warm and dry.  Psychiatric: Mood, memory, affect and judgment normal.  Vitals reviewed.   Results: None   Assessment & Plan:  MARVEL MCPHILLIPS is a 77 y.o. male with some external hemorrhoidal tissue and some degree of internal hemorrhoids with some protrusion on valsalva.  He is having no bleeding and complaining of discomfort.  His hemorrhoids overall are fairly minimal in comparison to others.    -We discussed hemorrhoid surgery for external hemorrhoids is very painful. The pain and discomfort that the patient is having currently will be magnified after the surgery for at least 2-3 weeks.  The patient will have feelings of constant pressure and pain in the area from the swelling and removal of the anoderm (skin around the anus). The internal hemorrhoids are not painful to remove because the same nerves are not involved, and the sensation is different, but removal of any external hemorrhoids will cause significant discomfort. They will need at least 4-6 weeks to recover from the surgery, and should not expect to be able to feel back to "normal for 6-8 weeks."    The risk of hemorrhoid surgery include bleeding, risk of infection  although rare, and the risk of narrowing the anal canal if too much tissue is removed. Given this risk, it is likely that only the 2 largest hemorrhoid columns would be removed during the initial surgery.  We have also discussed the risk of incontinence after surgery if the muscles were injured, and although this is rare that it can happen and is another reason to limit the amount of hemorrhoids removed.    -Given the above information, the patient is very leery of proceeding with a surgery, and especially given his limited timeframe until his seed implantation.  At this time, he was given samples of Recticare 5% lidocaine, and a prescription for [facility]'s formulation for  hemorrhoids which includes hydrocortisone, phenylephrine, lidocaine, and pramoxine   -Follow up PRN  All questions were answered to the satisfaction of the patient.  I spent over 45 minutes discussing the patient's issues, answering questions, and coordinating care.   Virl Cagey 08/24/2017, 12:35 PM

## 2017-08-28 ENCOUNTER — Telehealth: Payer: Self-pay | Admitting: General Surgery

## 2017-08-28 NOTE — Telephone Encounter (Signed)
Asking about lancing the hemorrhoids. Discussed that this is not an option for him as he has external hemorrhoids and no thrombosed hemorrhoids. And that we would be removing the hemorrhoidal column.  Dr. Jeffie Pollock has agreed to postpone the seeds, which will be placed through the anus.    Patient understands and wants to proceed.  Curlene Labrum, MD [facility] [address] [place], [place] [id number] [phone] (office)

## 2017-08-28 NOTE — H&P (Signed)
[facility] History and Physical  Reason for Referral: Hemorrhoids    Referring Physician: Self Referral      Chief Complaint    Hemorrhoids      Louis Barnett is a 77 y.o. male.  HPI: Louis Barnett is a very pleasant 77 yo who has a recent history of newly diagnosed prostate cancer who is going has been on Lupron shots and is going to undergo brachytherapy with seed implantation in February.  He reports that he has had hemorrhoids for some time, and that they do not bleed but do cause him discomfort and burning/ stinging.  He says that he has had issues 3-4 months ago, and that Preparation H helped, but recently he has tried this and it has not helped. He says that about 2 weeks ago the hemorrhoids had been causing him significant discomfort, and someone told him to use Witch Hazel soaked pads on the hemorrhoids, and that this has helped him somewhat.    He has BMs daily, and reports no constipation. He has had a colonoscopy with Dr. Laural Golden and had polyps.  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 He says that he last took plavix around Xmas time when he was eating the holiday foods. During the time of his TIAs, he was found to have high triglycerides and has been on medications for this and the plavix since that time.  He denies any heart problems or SOB.    The hemorrhoids he has are always out.       Past Medical History:  Diagnosis Date  . 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 Arthritis   . GERD (gastroesophageal reflux disease)   . High triglycerides   . History of nephrolithiasis   . Microscopic hematuria   . Prostate cancer ([place])   . Splenic artery aneurysm ([place])   . TIA (transient ischemic attack) 20002  . Ureteral stone    Left         Past Surgical History:  Procedure Laterality Date  . COLONOSCOPY  08/12/2006   [facility];mild divertlculosis in colon/sessile polyps in the rectum  .  COLONOSCOPY N/A 09/18/2015   Procedure: COLONOSCOPY;  Surgeon: Danie Binder, MD;  [facility];  Service: Endoscopy;  Laterality: N/A;  1330  . CYSTOSCOPY  2007  . CYSTOSCOPY WITH URETEROSCOPY  07/18/2011  . PROSTATE BIOPSY           Family History  Problem Relation Age of Onset  . Heart disease Father        heart attack  . Other Mother        varicose veins  . Diabetes Brother   . Heart disease Brother        heart attack  . Colon cancer Neg Hx   . Cancer Neg Hx     Social History       Tobacco Use  . Smoking status: Never Smoker  . Smokeless tobacco: Never Used  Substance Use Topics  . Alcohol use: No  . Drug use: No    Medications: I have reviewed the patient's current medications.     Allergies as of 08/21/2017      Reactions   Penicillins Rash, Other (See Comments)   Has patient had a PCN reaction causing immediate rash, facial/tongue/throat swelling, SOB or lightheadedness with hypotension: Yes Has patient had a PCN reaction causing severe rash  involving mucus membranes or skin necrosis: No Has patient had a PCN reaction that required hospitalization No Has patient had a PCN reaction occurring within the last 10 years: No If all of the above answers are "NO", then may proceed with Cephalosporin use.                  Medication List             Accurate as of 08/21/17 11:59 PM. Always use your most recent med list.           ADVIL PM PO Take by mouth.   clopidogrel 75 MG tablet Commonly known as:  PLAVIX Take 75 mg by mouth daily.   fenofibrate 48 MG tablet Commonly known as:  TRICOR Take 48 mg by mouth daily.   tamsulosin 0.4 MG Caps capsule Commonly known as:  FLOMAX Take 0.4 mg by mouth daily.        ROS:  A comprehensive review of systems was negative except for: Eyes: positive for eye pain Cardiovascular: positive for varicose veins Gastrointestinal: positive for heartburn,  indigestion, hemorrhoid discomfort Allergic/Immunologic: positive for allergies  Blood pressure 133/71, pulse 76, temperature 98.4 F (36.9 C), resp. rate 18, weight 163 lb (73.9 kg). Physical Exam  Constitutional: He is oriented to person, place, and time and well-developed, well-nourished, and in no distress.  HENT:  Head: Normocephalic.  Eyes: Pupils are equal, round, and reactive to light.  Neck: Normal range of motion.  Cardiovascular: Normal rate and regular rhythm.  Pulmonary/Chest: Effort normal and breath sounds normal.  Abdominal: Soft. He exhibits no distension. There is no tenderness.  Genitourinary:  Genitourinary Comments: No gross blood, small external hemorrhoidal tags, some component of swollen internal columns, no blood, mild tenderness  Musculoskeletal: Normal range of motion.  Neurological: He is alert and oriented to person, place, and time.  Skin: Skin is warm and dry.  Psychiatric: Mood, memory, affect and judgment normal.  Vitals reviewed.   Results: None   Assessment & Plan:  Louis Barnett is a 77 y.o. male with some external hemorrhoidal tissue and some degree of internal hemorrhoids with some protrusion on valsalva.  He is having no bleeding and complaining of discomfort.  His hemorrhoids overall are fairly minimal in comparison to others.    -We discussed hemorrhoid surgery for external hemorrhoids is very painful. The pain and discomfort that the patient is having currently will be magnified after the surgery for at least 2-3 weeks.  The patient will have feelings of constant pressure and pain in the area from the swelling and removal of the anoderm (skin around the anus). The internal hemorrhoids are not painful to remove because the same nerves are not involved, and the sensation is different, but removal of any external hemorrhoids will cause significant discomfort. They will need at least 4-6 weeks to recover from the surgery, and should not expect to  be able to feel back to "normal for 6-8 weeks."    The risk of hemorrhoid surgery include bleeding, risk of infection although rare, and the risk of narrowing the anal canal if too much tissue is removed. Given this risk, it is likely that only the 2 largest hemorrhoid columns would be removed during the initial surgery.  We have also discussed the risk of incontinence after surgery if the muscles were injured, and although this is rare that it can happen and is another reason to limit the amount of hemorrhoids removed.    -  Given the above information, the patient is very leery of proceeding with a surgery, and especially given his limited timeframe until his seed implantation.  At this time, he was given samples of Recticare 5% lidocaine, and a prescription for [facility]'s formulation for hemorrhoids which includes hydrocortisone, phenylephrine, lidocaine, and pramoxine   -Follow up PRN  All questions were answered to the satisfaction of the patient.  I spent over 45 minutes discussing the patient's issues, answering questions, and coordinating care.   Virl Cagey 08/24/2017, 12:35 PM    Discussed with Dr. Jeffie Pollock, he wants patient to get the hemorrhoid surgery, and can give him time to heal before seeds are placed through the anus.  He said that Louis Barnett is on the Lupron shots and this is controlling his cancer.  Curlene Labrum, MD [facility] [address] [place], [place] [id number] [phone] (office)

## 2017-08-29 NOTE — Patient Instructions (Signed)
Louis Barnett  08/29/2017     @PREFPERIOPPHARMACY @   Your procedure is scheduled on   09/03/2017  Report to [facility] at  955   A.M.  Call this number if you have problems the morning of surgery:  [phone]   Remember:  Do not eat food or drink liquids after midnight.  Take these medicines the morning of surgery with A SIP OF WATER  None  Do not wear jewelry, make-up or nail polish.  Do not wear lotions, powders, or perfumes, or deodorant.  Do not shave 48 hours prior to surgery.  Men may shave face and neck.  Do not bring valuables to the hospital.  [facility] is not responsible for any belongings or valuables.  Contacts, dentures or bridgework may not be worn into surgery.  Leave your suitcase in the car.  After surgery it may be brought to your room.  For patients admitted to the hospital, discharge time will be determined by your treatment team.  Patients discharged the day of surgery will not be allowed to drive home.   Name and phone number of your driver:   Family Special instructions:  None  Please read over the following fact sheets that you were given. Anesthesia Post-op Instructions and Care and Recovery After Surgery      Surgical Procedures for Hemorrhoids Surgical procedures can be used to treat hemorrhoids. Hemorrhoids are swollen veins that are inside the rectum (internal hemorrhoids) or around the anus (external hemorrhoids). They are caused by increased pressure in the anal area. This pressure may result from straining to have a bowel movement (constipation), diarrhea, pregnancy, obesity, anal sex, or sitting for long periods of time. Hemorrhoids can cause symptoms such as pain and bleeding. Surgery may be needed if diet changes, lifestyle changes, and other treatments do not help your symptoms. Various surgical methods may be used. Three common methods are:  Closed hemorrhoidectomy. The hemorrhoids are surgically removed, and the surgical cuts  (incisions) are closed with stitches (sutures).  Open hemorrhoidectomy. The hemorrhoids are surgically removed, but the incisions are allowed to heal without sutures.  Stapled hemorrhoidopexy. The hemorrhoids are removed using a device that takes out a ring of excess tissue.  Tell a health care provider about:  Any allergies you have.  All medicines you are taking, including vitamins, herbs, eye drops, creams, and over-the-counter medicines.  Any problems you or family members have had with anesthetic medicines.  Any blood disorders you have.  Any surgeries you have had.  Any medical conditions you have.  Whether you are pregnant or may be pregnant. What are the risks? Generally, this is a safe procedure. However, problems may occur, including:  Infection.  Bleeding.  Allergic reactions to medicines.  Damage to other structures or organs.  Pain.  Constipation.  Difficulty passing urine.  Narrowing of the anal canal (stenosis).  Difficulty controlling bowel movements (incontinence).  What happens before the procedure?  Ask your health care provider about: ? Changing or stopping your regular medicines. This is especially important if you are taking diabetes medicines or blood thinners. ? Taking medicines such as aspirin and ibuprofen. These medicines can thin your blood. Do not take these medicines before your procedure if your health care provider instructs you not to.  You may need to have a procedure to examine the inside of your colon with a scope (colonoscopy). Your health care provider may do this to make sure that  there are no other causes for your bleeding or pain.  Follow instructions from your health care provider about eating or drinking restrictions.  You may be instructed to take a laxative and an enema to clean out your colon before surgery (bowel prep). Carefully follow instructions from your health care provider about bowel prep.  Ask your health  care provider how your surgical site will be marked or identified.  You may be given antibiotic medicine to help prevent infection.  Plan to have someone take you home after the procedure. What happens during the procedure?  To reduce your risk of infection: ? Your health care team will wash or sanitize their hands. ? Your skin will be washed with soap.  An IV tube will be inserted into one of your veins.  You will be given one or more of the following: ? A medicine to help you relax (sedative). ? A medicine to numb the area (local anesthetic). ? A medicine to make you fall asleep (general anesthetic). ? A medicine that is injected into an area of your body to numb everything below the injection site (regional anesthetic).  A lubricating jelly may be placed into your rectum.  Your surgeon will insert a short scope (anoscope) into your rectum to examine the hemorrhoids.  One of the following hemorrhoid procedures will be performed. Closed Hemorrhoidectomy  Your surgeon will use surgical instruments to open the tissue around the hemorrhoids.  The veins that supply the hemorrhoids will be tied off with a suture.  The hemorrhoids will be removed.  The tissue that surrounds the hemorrhoids will be closed with sutures that your body can absorb (absorbable sutures). Open Hemorrhoidectomy  The hemorrhoids will be removed with surgical instruments.  The incisions will be left open to heal without sutures. Stapled Hemorrhoidopexy  Your surgeon will use a circular stapling device to remove the hemorrhoids.  The device will be inserted into your anus. It will remove a circular ring of tissue that includes hemorrhoid tissue and some tissue above the hemorrhoids.  The staples in the device will close the edges of removed tissue. This will cut off the blood supply to the hemorrhoids and will pull any remaining hemorrhoids back into place. Each of these procedures may vary among health  care providers and hospitals. What happens after the procedure?  Your blood pressure, heart rate, breathing rate, and blood oxygen level will be monitored often until the medicines you were given have worn off.  You will be given pain medicine as needed. This information is not intended to replace advice given to you by your health care provider. Make sure you discuss any questions you have with your health care provider. Document Released: 05/12/2009 Document Revised: 12/21/2015 Document Reviewed: 10/10/2014 Elsevier Interactive Patient Education  2018 [place] Anesthesia, Adult General anesthesia is the use of medicines to make a person "go to sleep" (be unconscious) for a medical procedure. General anesthesia is often recommended when a procedure:  Is long.  Requires you to be still or in an unusual position.  Is major and can cause you to lose blood.  Is impossible to do without general anesthesia.  The medicines used for general anesthesia are called general anesthetics. In addition to making you sleep, the medicines:  Prevent pain.  Control your blood pressure.  Relax your muscles.  Tell a health care provider about:  Any allergies you have.  All medicines you are taking, including vitamins, herbs, eye drops, creams, and  over-the-counter medicines.  Any problems you or family members have had with anesthetic medicines.  Types of anesthetics you have had in the past.  Any bleeding disorders you have.  Any surgeries you have had.  Any medical conditions you have.  Any history of heart or lung conditions, such as heart failure, sleep apnea, or chronic obstructive pulmonary disease (COPD).  Whether you are pregnant or may be pregnant.  Whether you use tobacco, alcohol, marijuana, or street drugs.  Any history of Armed forces logistics/support/administrative officer.  Any history of depression or anxiety. What are the risks? Generally, this is a safe procedure. However, problems may  occur, including:  Allergic reaction to anesthetics.  Lung and heart problems.  Inhaling food or liquids from your stomach into your lungs (aspiration).  Injury to nerves.  Waking up during your procedure and being unable to move (rare).  Extreme agitation or a state of mental confusion (delirium) when you wake up from the anesthetic.  Air in the bloodstream, which can lead to stroke.  These problems are more likely to develop if you are having a major surgery or if you have an advanced medical condition. You can prevent some of these complications by answering all of your health care provider's questions thoroughly and by following all pre-procedure instructions. General anesthesia can cause side effects, including:  Nausea or vomiting  A sore throat from the breathing tube.  Feeling cold or shivery.  Feeling tired, washed out, or achy.  Sleepiness or drowsiness.  Confusion or agitation.  What happens before the procedure? Staying hydrated Follow instructions from your health care provider about hydration, which may include:  Up to 2 hours before the procedure - you may continue to drink clear liquids, such as water, clear fruit juice, black coffee, and plain tea.  Eating and drinking restrictions Follow instructions from your health care provider about eating and drinking, which may include:  8 hours before the procedure - stop eating heavy meals or foods such as meat, fried foods, or fatty foods.  6 hours before the procedure - stop eating light meals or foods, such as toast or cereal.  6 hours before the procedure - stop drinking milk or drinks that contain milk.  2 hours before the procedure - stop drinking clear liquids.  Medicines  Ask your health care provider about: ? Changing or stopping your regular medicines. This is especially important if you are taking diabetes medicines or blood thinners. ? Taking medicines such as aspirin and ibuprofen. These  medicines can thin your blood. Do not take these medicines before your procedure if your health care provider instructs you not to. ? Taking new dietary supplements or medicines. Do not take these during the week before your procedure unless your health care provider approves them.  If you are told to take a medicine or to continue taking a medicine on the day of the procedure, take the medicine with sips of water. General instructions   Ask if you will be going home the same day, the following day, or after a longer hospital stay. ? Plan to have someone take you home. ? Plan to have someone stay with you for the first 24 hours after you leave the hospital or clinic.  For 3-6 weeks before the procedure, try not to use any tobacco products, such as cigarettes, chewing tobacco, and e-cigarettes.  You may brush your teeth on the morning of the procedure, but make sure to spit out the toothpaste. What happens during the  procedure?  You will be given anesthetics through a mask and through an IV tube in one of your veins.  You may receive medicine to help you relax (sedative).  As soon as you are asleep, a breathing tube may be used to help you breathe.  An anesthesia specialist will stay with you throughout the procedure. He or she will help keep you comfortable and safe by continuing to give you medicines and adjusting the amount of medicine that you get. He or she will also watch your blood pressure, pulse, and oxygen levels to make sure that the anesthetics do not cause any problems.  If a breathing tube was used to help you breathe, it will be removed before you wake up. The procedure may vary among health care providers and hospitals. What happens after the procedure?  You will wake up, often slowly, after the procedure is complete, usually in a recovery area.  Your blood pressure, heart rate, breathing rate, and blood oxygen level will be monitored until the medicines you were given  have worn off.  You may be given medicine to help you calm down if you feel anxious or agitated.  If you will be going home the same day, your health care provider may check to make sure you can stand, drink, and urinate.  Your health care providers will treat your pain and side effects before you go home.  Do not drive for 24 hours if you received a sedative.  You may: ? Feel nauseous and vomit. ? Have a sore throat. ? Have mental slowness. ? Feel cold or shivery. ? Feel sleepy. ? Feel tired. ? Feel sore or achy, even in parts of your body where you did not have surgery. This information is not intended to replace advice given to you by your health care provider. Make sure you discuss any questions you have with your health care provider. Document Released: 10/22/2007 Document Revised: 12/26/2015 Document Reviewed: 06/29/2015 Elsevier Interactive Patient Education  2018 [place] Anesthesia, Adult, Care After These instructions provide you with information about caring for yourself after your procedure. Your health care provider may also give you more specific instructions. Your treatment has been planned according to current medical practices, but problems sometimes occur. Call your health care provider if you have any problems or questions after your procedure. What can I expect after the procedure? After the procedure, it is common to have:  Vomiting.  A sore throat.  Mental slowness.  It is common to feel:  Nauseous.  Cold or shivery.  Sleepy.  Tired.  Sore or achy, even in parts of your body where you did not have surgery.  Follow these instructions at home: For at least 24 hours after the procedure:  Do not: ? Participate in activities where you could fall or become injured. ? Drive. ? Use heavy machinery. ? Drink alcohol. ? Take sleeping pills or medicines that cause drowsiness. ? Make important decisions or sign legal documents. ? Take care  of children on your own.  Rest. Eating and drinking  If you vomit, drink water, juice, or soup when you can drink without vomiting.  Drink enough fluid to keep your urine clear or pale yellow.  Make sure you have little or no nausea before eating solid foods.  Follow the diet recommended by your health care provider. General instructions  Have a responsible adult stay with you until you are awake and alert.  Return to your normal activities as told  by your health care provider. Ask your health care provider what activities are safe for you.  Take over-the-counter and prescription medicines only as told by your health care provider.  If you smoke, do not smoke without supervision.  Keep all follow-up visits as told by your health care provider. This is important. Contact a health care provider if:  You continue to have nausea or vomiting at home, and medicines are not helpful.  You cannot drink fluids or start eating again.  You cannot urinate after 8-12 hours.  You develop a skin rash.  You have fever.  You have increasing redness at the site of your procedure. Get help right away if:  You have difficulty breathing.  You have chest pain.  You have unexpected bleeding.  You feel that you are having a life-threatening or urgent problem. This information is not intended to replace advice given to you by your health care provider. Make sure you discuss any questions you have with your health care provider. Document Released: 10/21/2000 Document Revised: 12/18/2015 Document Reviewed: 06/29/2015 Elsevier Interactive Patient Education  [employer].

## 2017-09-02 ENCOUNTER — Encounter (HOSPITAL_COMMUNITY): Payer: Self-pay

## 2017-09-02 ENCOUNTER — Encounter (HOSPITAL_COMMUNITY)
Admission: RE | Admit: 2017-09-02 | Discharge: 2017-09-02 | Disposition: A | Discharge status: Home / Self Care | Payer: Medicare Other | Source: Ambulatory Visit | Attending: General Surgery | Admitting: General Surgery

## 2017-09-02 ENCOUNTER — Other Ambulatory Visit: Payer: Self-pay

## 2017-09-02 DIAGNOSIS — Z8673 Personal history of transient ischemic attack (TIA), and cerebral infarction without residual deficits: Secondary | ICD-10-CM | POA: Diagnosis not present

## 2017-09-02 DIAGNOSIS — K644 Residual hemorrhoidal skin tags: Secondary | ICD-10-CM | POA: Diagnosis not present

## 2017-09-02 DIAGNOSIS — C61 Malignant neoplasm of prostate: Secondary | ICD-10-CM | POA: Diagnosis not present

## 2017-09-02 DIAGNOSIS — Z8249 Family history of ischemic heart disease and other diseases of the circulatory system: Secondary | ICD-10-CM | POA: Diagnosis not present

## 2017-09-02 DIAGNOSIS — Z880 Allergy status to penicillin: Secondary | ICD-10-CM | POA: Diagnosis not present

## 2017-09-02 DIAGNOSIS — Z833 Family history of diabetes mellitus: Secondary | ICD-10-CM | POA: Diagnosis not present

## 2017-09-02 DIAGNOSIS — K648 Other hemorrhoids: Secondary | ICD-10-CM | POA: Diagnosis not present

## 2017-09-02 DIAGNOSIS — K219 Gastro-esophageal reflux disease without esophagitis: Secondary | ICD-10-CM | POA: Diagnosis not present

## 2017-09-02 DIAGNOSIS — Z7902 Long term (current) use of antithrombotics/antiplatelets: Secondary | ICD-10-CM | POA: Diagnosis not present

## 2017-09-02 DIAGNOSIS — E781 Pure hyperglyceridemia: Secondary | ICD-10-CM | POA: Diagnosis not present

## 2017-09-02 DIAGNOSIS — M1990 Unspecified osteoarthritis, unspecified site: Secondary | ICD-10-CM | POA: Diagnosis not present

## 2017-09-02 DIAGNOSIS — Z79899 Other long term (current) drug therapy: Secondary | ICD-10-CM | POA: Diagnosis not present

## 2017-09-02 HISTORY — DX: Personal history of urinary calculi: Z87.442

## 2017-09-02 LAB — CBC WITH DIFFERENTIAL/PLATELET
BASOS ABS: 0 10*3/uL (ref 0.0–0.1)
BASOS PCT: 0 %
EOS ABS: 0 10*3/uL (ref 0.0–0.7)
Eosinophils Relative: 1 %
HEMATOCRIT: 41.8 % (ref 39.0–52.0)
Hemoglobin: 14.2 g/dL (ref 13.0–17.0)
Lymphocytes Relative: 17 %
Lymphs Abs: 0.9 10*3/uL (ref 0.7–4.0)
MCH: 32.6 pg (ref 26.0–34.0)
MCHC: 34 g/dL (ref 30.0–36.0)
MCV: 95.9 fL (ref 78.0–100.0)
MONO ABS: 0.4 10*3/uL (ref 0.1–1.0)
Monocytes Relative: 7 %
NEUTROS ABS: 3.8 10*3/uL (ref 1.7–7.7)
NEUTROS PCT: 75 %
PLATELETS: 134 10*3/uL — AB (ref 150–400)
RBC: 4.36 MIL/uL (ref 4.22–5.81)
RDW: 12.6 % (ref 11.5–15.5)
WBC: 5.1 10*3/uL (ref 4.0–10.5)

## 2017-09-02 LAB — BASIC METABOLIC PANEL
ANION GAP: 10 (ref 5–15)
BUN: 28 mg/dL — ABNORMAL HIGH (ref 6–20)
CO2: 26 mmol/L (ref 22–32)
Calcium: 9.2 mg/dL (ref 8.9–10.3)
Chloride: 103 mmol/L (ref 101–111)
Creatinine, Ser: 1.37 mg/dL — ABNORMAL HIGH (ref 0.61–1.24)
GFR, EST AFRICAN AMERICAN: 56 mL/min — AB (ref >60)
GFR, EST NON AFRICAN AMERICAN: 49 mL/min — AB (ref >60)
Glucose, Bld: 175 mg/dL — ABNORMAL HIGH (ref 65–99)
Potassium: 3.8 mmol/L (ref 3.5–5.1)
SODIUM: 139 mmol/L (ref 135–145)

## 2017-09-03 ENCOUNTER — Encounter (HOSPITAL_COMMUNITY)
Admission: RE | Disposition: A | Discharge status: Home / Self Care | Payer: Self-pay | Source: Ambulatory Visit | Attending: General Surgery

## 2017-09-03 ENCOUNTER — Ambulatory Visit (HOSPITAL_COMMUNITY): Payer: Medicare Other | Admitting: Anesthesiology

## 2017-09-03 ENCOUNTER — Ambulatory Visit (HOSPITAL_COMMUNITY)
Admission: RE | Admit: 2017-09-03 | Discharge: 2017-09-03 | Disposition: A | Discharge status: Home / Self Care | Payer: Medicare Other | Source: Ambulatory Visit | Attending: General Surgery | Admitting: General Surgery

## 2017-09-03 DIAGNOSIS — Z833 Family history of diabetes mellitus: Secondary | ICD-10-CM | POA: Insufficient documentation

## 2017-09-03 DIAGNOSIS — Z8249 Family history of ischemic heart disease and other diseases of the circulatory system: Secondary | ICD-10-CM | POA: Diagnosis not present

## 2017-09-03 DIAGNOSIS — K219 Gastro-esophageal reflux disease without esophagitis: Secondary | ICD-10-CM | POA: Diagnosis not present

## 2017-09-03 DIAGNOSIS — M1990 Unspecified osteoarthritis, unspecified site: Secondary | ICD-10-CM | POA: Insufficient documentation

## 2017-09-03 DIAGNOSIS — E781 Pure hyperglyceridemia: Secondary | ICD-10-CM | POA: Insufficient documentation

## 2017-09-03 DIAGNOSIS — Z79899 Other long term (current) drug therapy: Secondary | ICD-10-CM | POA: Diagnosis not present

## 2017-09-03 DIAGNOSIS — Z880 Allergy status to penicillin: Secondary | ICD-10-CM | POA: Insufficient documentation

## 2017-09-03 DIAGNOSIS — K648 Other hemorrhoids: Secondary | ICD-10-CM | POA: Insufficient documentation

## 2017-09-03 DIAGNOSIS — Z7902 Long term (current) use of antithrombotics/antiplatelets: Secondary | ICD-10-CM | POA: Insufficient documentation

## 2017-09-03 DIAGNOSIS — Z8673 Personal history of transient ischemic attack (TIA), and cerebral infarction without residual deficits: Secondary | ICD-10-CM | POA: Insufficient documentation

## 2017-09-03 DIAGNOSIS — C61 Malignant neoplasm of prostate: Secondary | ICD-10-CM | POA: Insufficient documentation

## 2017-09-03 DIAGNOSIS — L918 Other hypertrophic disorders of the skin: Secondary | ICD-10-CM | POA: Diagnosis not present

## 2017-09-03 DIAGNOSIS — K649 Unspecified hemorrhoids: Secondary | ICD-10-CM | POA: Diagnosis not present

## 2017-09-03 DIAGNOSIS — K644 Residual hemorrhoidal skin tags: Secondary | ICD-10-CM | POA: Insufficient documentation

## 2017-09-03 HISTORY — PX: HEMORRHOID SURGERY: SHX153

## 2017-09-03 SURGERY — HEMORRHOIDECTOMY
Anesthesia: General | Site: Rectum

## 2017-09-03 MED ORDER — BUPIVACAINE LIPOSOME 1.3 % IJ SUSP
INTRAMUSCULAR | Status: DC | PRN
  Administered 2017-09-03: 20 mL

## 2017-09-03 MED ORDER — GLYCOPYRROLATE 0.2 MG/ML IJ SOLN
INTRAMUSCULAR | Status: AC
  Filled 2017-09-03: qty 2

## 2017-09-03 MED ORDER — ONDANSETRON HCL 4 MG/2ML IJ SOLN
INTRAMUSCULAR | Status: AC
  Filled 2017-09-03: qty 2

## 2017-09-03 MED ORDER — FENTANYL CITRATE (PF) 100 MCG/2ML IJ SOLN
INTRAMUSCULAR | Status: DC | PRN
  Administered 2017-09-03 (×2): 25 ug via INTRAVENOUS
  Administered 2017-09-03 (×4): 12.5 ug via INTRAVENOUS

## 2017-09-03 MED ORDER — CHLORHEXIDINE GLUCONATE CLOTH 2 % EX PADS: 6.0000 | MEDICATED_PAD | Freq: Once | CUTANEOUS | Status: DC

## 2017-09-03 MED ORDER — MIDAZOLAM HCL 2 MG/2ML IJ SOLN
1.0000 mg | INTRAMUSCULAR | Status: AC
  Administered 2017-09-03: 2 mg via INTRAVENOUS

## 2017-09-03 MED ORDER — MIDAZOLAM HCL 2 MG/2ML IJ SOLN
INTRAMUSCULAR | Status: AC
  Filled 2017-09-03: qty 2

## 2017-09-03 MED ORDER — ALPRAZOLAM 0.25 MG PO TABS: 0.2500 mg | ORAL_TABLET | Freq: Three times a day (TID) | ORAL | 0 refills | Status: DC | PRN

## 2017-09-03 MED ORDER — SODIUM CHLORIDE 0.9 % IR SOLN
Status: DC | PRN
  Administered 2017-09-03: 1000 mL

## 2017-09-03 MED ORDER — FENTANYL CITRATE (PF) 100 MCG/2ML IJ SOLN: 25.0000 ug | INTRAMUSCULAR | Status: DC | PRN

## 2017-09-03 MED ORDER — LACTATED RINGERS IV SOLN
INTRAVENOUS | Status: DC
  Administered 2017-09-03: 11:00:00 via INTRAVENOUS

## 2017-09-03 MED ORDER — ROXICODONE 5 MG PO TABS: 5.0000 mg | ORAL_TABLET | ORAL | 0 refills | Status: DC | PRN

## 2017-09-03 MED ORDER — FENTANYL CITRATE (PF) 100 MCG/2ML IJ SOLN
INTRAMUSCULAR | Status: AC
  Filled 2017-09-03: qty 2

## 2017-09-03 MED ORDER — LIDOCAINE VISCOUS 2 % MT SOLN
OROMUCOSAL | Status: AC
  Filled 2017-09-03: qty 15

## 2017-09-03 MED ORDER — BUPIVACAINE LIPOSOME 1.3 % IJ SUSP
INTRAMUSCULAR | Status: AC
  Filled 2017-09-03: qty 20

## 2017-09-03 MED ORDER — ONDANSETRON HCL 4 MG/2ML IJ SOLN
4.0000 mg | Freq: Once | INTRAMUSCULAR | Status: AC
  Administered 2017-09-03: 4 mg via INTRAVENOUS

## 2017-09-03 MED ORDER — HEMOSTATIC AGENTS (NO CHARGE) OPTIME
TOPICAL | Status: DC | PRN
  Administered 2017-09-03: 1 via TOPICAL

## 2017-09-03 MED ORDER — SODIUM CHLORIDE 0.9 % IJ SOLN
INTRAMUSCULAR | Status: AC
  Filled 2017-09-03: qty 10

## 2017-09-03 MED ORDER — ALBUTEROL SULFATE HFA 108 (90 BASE) MCG/ACT IN AERS
INHALATION_SPRAY | RESPIRATORY_TRACT | Status: AC
  Filled 2017-09-03: qty 6.7

## 2017-09-03 MED ORDER — MORPHINE SULFATE (PF) 2 MG/ML IV SOLN
INTRAVENOUS | Status: AC
  Filled 2017-09-03: qty 1

## 2017-09-03 MED ORDER — LIDOCAINE VISCOUS 2 % MT SOLN
OROMUCOSAL | Status: DC | PRN
  Administered 2017-09-03: 1 via OROMUCOSAL

## 2017-09-03 MED ORDER — PROPOFOL 10 MG/ML IV BOLUS
INTRAVENOUS | Status: DC | PRN
  Administered 2017-09-03: 30 mg via INTRAVENOUS
  Administered 2017-09-03: 140 mg via INTRAVENOUS

## 2017-09-03 MED ORDER — ETOMIDATE 2 MG/ML IV SOLN
INTRAVENOUS | Status: AC
  Filled 2017-09-03: qty 10

## 2017-09-03 MED ORDER — EPHEDRINE SULFATE 50 MG/ML IJ SOLN
INTRAMUSCULAR | Status: AC
Start: 2017-09-03 — End: ?
  Filled 2017-09-03: qty 1

## 2017-09-03 SURGICAL SUPPLY — 33 items
BAG HAMPER (MISCELLANEOUS) ×3 IMPLANT
CLOTH BEACON ORANGE TIMEOUT ST (SAFETY) ×3 IMPLANT
COVER LIGHT HANDLE STERIS (MISCELLANEOUS) ×6 IMPLANT
DRAPE HALF SHEET 40X57 (DRAPES) ×3 IMPLANT
DRAPE PROXIMA HALF (DRAPES) ×3 IMPLANT
ELECT REM PT RETURN 9FT ADLT (ELECTROSURGICAL) ×3
ELECTRODE REM PT RTRN 9FT ADLT (ELECTROSURGICAL) ×1 IMPLANT
GAUZE SPONGE 4X4 12PLY STRL (GAUZE/BANDAGES/DRESSINGS) ×6 IMPLANT
GLOVE BIO SURGEON STRL SZ 6.5 (GLOVE) ×4 IMPLANT
GLOVE BIO SURGEONS STRL SZ 6.5 (GLOVE) ×2
GLOVE BIOGEL PI IND STRL 6.5 (GLOVE) ×1 IMPLANT
GLOVE BIOGEL PI IND STRL 7.0 (GLOVE) ×1 IMPLANT
GLOVE BIOGEL PI INDICATOR 6.5 (GLOVE) ×4
GLOVE BIOGEL PI INDICATOR 7.0 (GLOVE) ×2
GLOVE SURG SS PI 6.5 STRL IVOR (GLOVE) ×2 IMPLANT
GOWN STRL REUS W/ TWL XL LVL3 (GOWN DISPOSABLE) ×1 IMPLANT
GOWN STRL REUS W/TWL LRG LVL3 (GOWN DISPOSABLE) ×3 IMPLANT
GOWN STRL REUS W/TWL XL LVL3 (GOWN DISPOSABLE) ×3
HEMOSTAT SURGICEL 4X8 (HEMOSTASIS) ×3 IMPLANT
KIT ROOM TURNOVER AP CYSTO (KITS) ×3 IMPLANT
LIGASURE IMPACT 36 18CM CVD LR (INSTRUMENTS) ×3 IMPLANT
MANIFOLD NEPTUNE II (INSTRUMENTS) ×3 IMPLANT
NEEDLE HYPO 22GX1.5 SAFETY (NEEDLE) ×3 IMPLANT
NS IRRIG 1000ML POUR BTL (IV SOLUTION) ×3 IMPLANT
PACK PERI GYN (CUSTOM PROCEDURE TRAY) ×3 IMPLANT
PAD ARMBOARD 7.5X6 YLW CONV (MISCELLANEOUS) ×3 IMPLANT
SET BASIN LINEN APH (SET/KITS/TRAYS/PACK) ×3 IMPLANT
SPONGE SURGIFOAM ABS GEL 100 (HEMOSTASIS) ×2 IMPLANT
SURGILUBE 3G PEEL PACK STRL (MISCELLANEOUS) ×3 IMPLANT
SUT SILK 0 FSL (SUTURE) ×2 IMPLANT
SUT VIC AB 2-0 CT2 27 (SUTURE) ×2 IMPLANT
SYR 20CC LL (SYRINGE) ×3 IMPLANT
SYR BULB IRRIGATION 50ML (SYRINGE) ×3 IMPLANT

## 2017-09-03 NOTE — Discharge Instructions (Signed)
Please take your roxicodone as prescribed and alternate with tylenol every 4-6 hours.   Do not take any aspirin or NSAIDs, ibuprofen for 5 days. Once the 5 days have elapsed, you can take ibuprofen for pain control. Please keep the area clean and dry and take Sitz baths (swallow warm water baths) for comfort and after bowel movements.  Please keep your stools soft and take fiber daily (metamucil) and colace (over the counter) to help prevent constipation.  If you have not had a BM in 2 days, please notify Dr. Constance Haw.  Expect some bleeding following the hemorrhoid surgery and significant pain.  Go to the ED with extensive bleeding, fevers, or chills.   Do not take any blood thinners including plavix for 5 days.  Take xanax as needed for anxiety. If this medication works for you, Dr. Gerarda Fraction or a PCP will need to prescribe longterm.  DO NOT [place] WITH THE XANAX.   How to Take a Sitz Bath A sitz bath is a warm water bath that is taken while you are sitting down. The water should only come up to your hips and should cover your buttocks. Your health care provider may recommend a sitz bath to help you:  Clean the lower part of your body, including your genital area.  With itching.  With pain.  With sore muscles or muscles that tighten or spasm.  How to take a sitz bath Take 3-4 sitz baths per day or as told by your health care provider. 1. Partially fill a bathtub with warm water. You will only need the water to be deep enough to cover your hips and buttocks when you are sitting in it. 2. If your health care provider told you to put medicine in the water, follow the directions exactly. 3. Sit in the water and open the tub drain a little. 4. Turn on the warm water again to keep the tub at the correct level. Keep the water running constantly. 5. Soak in the water for 15-20 minutes or as told by your health care provider. 6. After the sitz bath, pat the affected area dry  first. Do not rub it. 7. Be careful when you stand up after the sitz bath because you may feel dizzy.  Contact a health care provider if:  Your symptoms get worse. Do not continue with sitz baths if your symptoms get worse.  You have new symptoms. Do not continue with sitz baths until you talk with your health care provider. This information is not intended to replace advice given to you by your health care provider. Make sure you discuss any questions you have with your health care provider. Document Released: 04/06/2004 Document Revised: 12/13/2015 Document Reviewed: 07/13/2014 Elsevier Interactive Patient Education  2018 [employer].  Surgical Procedures for Hemorrhoids, Care After Refer to this sheet in the next few weeks. These instructions provide you with information about caring for yourself after your procedure. Your health care provider may also give you more specific instructions. Your treatment has been planned according to current medical practices, but problems sometimes occur. Call your health care provider if you have any problems or questions after your procedure. What can I expect after the procedure? After the procedure, it is common to have:  Rectal pain and pressure.  Sensation that you are going to have a bowel movement but cannot.   Pain when you are having a bowel movement.  Slight rectal bleeding.  Follow these instructions at home:  Medicines  Take over-the-counter and prescription medicines only as told by your health care provider.  Do not drive or operate heavy machinery while taking prescription pain medicine.  Use a stool softener or a bulk laxative as told by your health care provider. Activity  Rest at home. Return to your normal activities as told by your health care provider.  Do not lift anything that is heavier than 10 lb (4.5 kg).  Do not sit for long periods of time. Take a walk every day or as told by your health care provider.  Do not  strain to have a bowel movement. Do not spend a long time sitting on the toilet. Eating and drinking  Eat foods that contain fiber, such as whole grains, beans, nuts, fruits, and vegetables.  Drink enough fluid to keep your urine clear or pale yellow. General instructions  Sit in a warm bath 2-3 times per day to relieve soreness or itching.  Keep all follow-up visits as told by your health care provider. This is important. Contact a health care provider if:  Your pain medicine is not helping.  You have a fever or chills.  You become constipated.  You have trouble passing urine. Get help right away if:  You have very bad rectal pain.  You have heavy bleeding from your rectum. This information is not intended to replace advice given to you by your health care provider. Make sure you discuss any questions you have with your health care provider. Document Released: 10/05/2003 Document Revised: 12/21/2015 Document Reviewed: 10/10/2014 Elsevier Interactive Patient Education  2018 [employer].  Alprazolam tablets What is this medicine? ALPRAZOLAM (al PRAY zoe lam) is a benzodiazepine. It is used to treat anxiety and panic attacks. This medicine may be used for other purposes; ask your health care provider or pharmacist if you have questions. COMMON BRAND NAME(S): Xanax What should I tell my health care provider before I take this medicine? They need to know if you have any of these conditions: -an alcohol or drug abuse problem -bipolar disorder, depression, psychosis or other mental health conditions -glaucoma -kidney or liver disease -lung or breathing disease -myasthenia gravis -Parkinson's disease -porphyria -seizures or a history of seizures -suicidal thoughts -an unusual or allergic reaction to alprazolam, other benzodiazepines, foods, dyes, or preservatives -pregnant or trying to get pregnant -breast-feeding How should I use this medicine? Take this medicine by  mouth with a glass of water. Follow the directions on the prescription label. Take your medicine at regular intervals. Do not take it more often than directed. Do not stop taking except on your doctor's advice. A special MedGuide will be given to you by the pharmacist with each prescription and refill. Be sure to read this information carefully each time. Talk to your pediatrician regarding the use of this medicine in children. Special care may be needed. Overdosage: If you think you have taken too much of this medicine contact a poison control center or emergency room at once. NOTE: This medicine is only for you. Do not share this medicine with others. What if I miss a dose? If you miss a dose, take it as soon as you can. If it is almost time for your next dose, take only that dose. Do not take double or extra doses. What may interact with this medicine? Do not take this medicine with any of the following medications: -certain antiviral medicines for HIV or AIDS like delavirdine, indinavir -certain medicines for fungal infections like ketoconazole and  itraconazole -narcotic medicines for cough -sodium oxybate This medicine may also interact with the following medications: -alcohol -antihistamines for allergy, cough and cold -certain antibiotics like clarithromycin, erythromycin, isoniazid, rifampin, rifapentine, rifabutin, and troleandomycin -certain medicines for blood pressure, heart disease, irregular heart beat -certain medicines for depression, like amitriptyline, fluoxetine, sertraline -certain medicines for seizures like carbamazepine, oxcarbazepine, phenobarbital, phenytoin, primidone -cimetidine -cyclosporine -male hormones, like estrogens or progestins and birth control pills, patches, rings, or injections -general anesthetics like halothane, isoflurane, methoxyflurane, propofol -grapefruit juice -local anesthetics like lidocaine, pramoxine, tetracaine -medicines that relax  muscles for surgery -narcotic medicines for pain -other antiviral medicines for HIV or AIDS -phenothiazines like chlorpromazine, mesoridazine, prochlorperazine, thioridazine This list may not describe all possible interactions. Give your health care provider a list of all the medicines, herbs, non-prescription drugs, or dietary supplements you use. Also tell them if you smoke, drink alcohol, or use illegal drugs. Some items may interact with your medicine. What should I watch for while using this medicine? Tell your doctor or health care professional if your symptoms do not start to get better or if they get worse. Do not stop taking except on your doctor's advice. You may develop a severe reaction. Your doctor will tell you how much medicine to take. You may get drowsy or dizzy. Do not drive, use machinery, or do anything that needs mental alertness until you know how this medicine affects you. To reduce the risk of dizzy and fainting spells, do not stand or sit up quickly, especially if you are an older patient. Alcohol may increase dizziness and drowsiness. Avoid alcoholic drinks. If you are taking another medicine that also causes drowsiness, you may have more side effects. Give your health care provider a list of all medicines you use. Your doctor will tell you how much medicine to take. Do not take more medicine than directed. Call emergency for help if you have problems breathing or unusual sleepiness. What side effects may I notice from receiving this medicine? Side effects that you should report to your doctor or health care professional as soon as possible: -allergic reactions like skin rash, itching or hives, swelling of the face, lips, or tongue -breathing problems -confusion -loss of balance or coordination -signs and symptoms of low blood pressure like dizziness; feeling faint or lightheaded, falls; unusually weak or tired -suicidal thoughts or other mood changes Side effects that  usually do not require medical attention (report to your doctor or health care professional if they continue or are bothersome): -dizziness -dry mouth -nausea, vomiting -tiredness This list may not describe all possible side effects. Call your doctor for medical advice about side effects. You may report side effects to FDA at 1-800-FDA-1088. Where should I keep my medicine? Keep out of the reach of children. This medicine can be abused. Keep your medicine in a safe place to protect it from theft. Do not share this medicine with anyone. Selling or giving away this medicine is dangerous and against the law. Store at room temperature between 20 and 25 degrees C (68 and 77 degrees F). This medicine may cause accidental overdose and death if taken by other adults, children, or pets. Mix any unused medicine with a substance like cat litter or coffee grounds. Then throw the medicine away in a sealed container like a sealed bag or a coffee can with a lid. Do not use the medicine after the expiration date. NOTE: This sheet is a summary. It may not cover all possible information. If  you have questions about this medicine, talk to your doctor, pharmacist, or health care provider.  2018 Elsevier/Gold Standard (2015-04-13 13:47:25)

## 2017-09-03 NOTE — Interval H&P Note (Signed)
History and Physical Interval Note:  09/03/2017 10:28 AM  Louis Barnett  has presented today for surgery, with the diagnosis of external and internal hemorrhoids  The various methods of treatment have been discussed with the patient and family. After consideration of risks, benefits and other options for treatment, the patient has consented to  Procedure(s): SIMPLE HEMORRHOIDECTOMY (N/A) as a surgical intervention .  The patient's history has been reviewed, patient examined, no change in status, stable for surgery.  I have reviewed the patient's chart and labs.  Questions were answered to the patient's satisfaction.    No additional questions.  Virl Cagey

## 2017-09-03 NOTE — Anesthesia Preprocedure Evaluation (Signed)
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 Resolved with plavix. TIAnegative psych ROS   GI/Hepatic Neg liver ROS, GERD  Controlled and Medicated,  Endo/Other  negative endocrine ROS  Renal/GU negative Renal ROS  negative genitourinary   Musculoskeletal   Abdominal   Peds  Hematology negative hematology ROS (+)   Anesthesia Other Findings   Reproductive/Obstetrics negative OB ROS                             Anesthesia Physical Anesthesia Plan  ASA: III  Anesthesia Plan: General   Post-op Pain Management:    Induction: Intravenous  PONV Risk Score and Plan:   Airway Management Planned: LMA  Additional Equipment:   Intra-op Plan:   Post-operative Plan: Extubation in OR  Informed Consent: I have reviewed the patients History and Physical, chart, labs and discussed the procedure including the risks, benefits and alternatives for the proposed anesthesia with the patient or authorized representative who has indicated his/her understanding and acceptance.     Plan Discussed with:   Anesthesia Plan Comments:         Anesthesia Quick Evaluation

## 2017-09-03 NOTE — Transfer of Care (Signed)
Immediate Anesthesia Transfer of Care Note  Patient: Louis Barnett  Procedure(s) Performed: EXTENSIVE HEMORRHOIDECTOMY (N/A Rectum)  Patient Location: PACU  Anesthesia Type:General  Level of Consciousness: awake, alert  and patient cooperative  Airway & Oxygen Therapy: Patient Spontanous Breathing and Patient connected to nasal cannula oxygen  Post-op Assessment: Report given to RN and Post -op Vital signs reviewed and stable  Post vital signs: Reviewed and stable  Last Vitals:  Vitals:   09/03/17 1150 09/03/17 1155  BP: 126/65   Pulse:    Resp: 12 15  Temp:    SpO2: 100% 100%    Last Pain:  Vitals:   09/03/17 1017  TempSrc: Oral  PainSc: 0-No pain         Complications: No apparent anesthesia complications

## 2017-09-03 NOTE — Anesthesia Procedure Notes (Signed)
Procedure Name: LMA Insertion Date/Time: 09/03/2017 12:09 PM Performed by: Vista Deck, CRNA Pre-anesthesia Checklist: Patient identified, Patient being monitored, Emergency Drugs available, Timeout performed and Suction available Patient Re-evaluated:Patient Re-evaluated prior to induction Oxygen Delivery Method: Circle System Utilized Preoxygenation: Pre-oxygenation with 100% oxygen Induction Type: IV induction Ventilation: Mask ventilation without difficulty LMA: LMA inserted LMA Size: 4.0 Number of attempts: 1 Placement Confirmation: positive ETCO2 and breath sounds checked- equal and bilateral Tube secured with: Tape Dental Injury: Teeth and Oropharynx as per pre-operative assessment

## 2017-09-03 NOTE — Op Note (Signed)
[facility] Operative Note  09/03/17  Preoperative Diagnosis: Internal and external hemorrhoids    Postoperative Diagnosis: Same   Procedure(s) Performed: Extensive hemorrhoidectomy of left lateral column, right posterior column, and right anterior skin tag    Surgeon: Lanell Matar. Constance Haw, MD   Assistants: No qualified resident was available   Anesthesia: General endotracheal   Anesthesiologist: Dr. Patsey Berthold    Specimens: Left lateral hemorrhoid column, right posterior hemorrhoid column, right anterior skin tag    Estimated Blood Loss: Minimal   Blood Replacement: None    Complications: None   Wound Class: Dirty    Operative Indications: [name] is a very pleasant 77 yo with a history of hemorrhoids that have been causing him some discomfort as well as prostate cancer with plans for seed placement in the upcoming weeks. He has been to see me and tried a prescription cream but continue to have severe discomfort and anxiety regarding the hemorrhoids. After speaking with Urology, Dr. Jeffie Pollock, he was ok with the patient undergoing a hemorrhoidectomy with plans to delay the seed placement until after he healed (6-8 weeks).  After a discussion of the risk and benefits including bleeding, risk of infection although rare, and the risk of narrowing the anal canal if too much tissue is removed, the risk of incontinence after surgery if the muscles were injured, he opted to proceed.   Findings: External hemorrhoids with skin tags on the left lateral, right posterior and right anterior region, and internal hemorrhoids extending up and prolapsing out on the left lateral and right posterior, no large hemorrhoid column in the right anterior just a skin tag    Procedure: The patient was taken to the operating room and general endotracheal anesthesia was induced. He was placed in the lithotomy position with all pressure points padded. The peritoneum was prepped and draped in the  normal sterile fashion.   A rectal exam was performed that demonstrated somewhat of a loose tone with anesthesia. No other gross lesions were noted. The anoscope was inserted and the hemorrhoidal columns were inspected. The left lateral was the largest and most prolapsed from the internal component with a small external skin tag.  The right posterior was the second largest and similar findings, while the right anterior did not have a significant internal component but an external skin tag that was present.    Given these findings, the anoderm at the left lateral hemorrhoid was lifting off the muscle complex and incised with cautery.  This was lifted off the muscle complex, and the sphincter muscle were felt deep to the tissue being elevated. A Ligasure device was used to remove the remaining internal component in continuity with the external component.  Attention was then turned to the right posterior hemorrhoidal column where a similar procedure was performed as described above.  Further inspection demonstrated adequate skin Louis Barnett between the left lateral and right posterior excision sites.  The right anterior external hemorrhoidal skin tag was elevated off the sphincter complex and the anoderm was incised. The sphincter complex was again palpated and was deep the tissue being elevated. The Ligasure was used to excise the external skin tag in this area.    Final inspection revealed adequate skin Louis Barnett, and no evidence of any muscle injury.  There was adequate hemostasis.  A rolled up surgical foam with surgicel soaked in viscus lidocaine was placed in the anus and tagged with a silk suture for easy removal.  Xparel was placed around the anal area.  All counts were correct at the end of the case. The patient was awakened from anesthesia and extubate without complication.  The patient went to the PACU in stable condition.   Curlene Labrum, MD [facility] [address] [place], [place] [address] [phone] (office)

## 2017-09-03 NOTE — Progress Notes (Signed)
[facility]  Spoke with patient's family post op. They describe the patient as having a significant amount of anxiety regarding his hemorrhoids, the discomfort, and his prostate cancer diagnosis.  They have asked if I can prescribe him anything for this anxiety.  I will give him a 0.25 mg Rx for Xanax and give him 10 pills to try.    I have warned against the risk of taking a benzodiazepine in the elderly but understand he is under a fair amount of stress at the moment.   If he needs a refill, he will need to contact his PCP.  Louis Labrum, MD [facility] [address] [place], [place] [address] [phone] (office)

## 2017-09-03 NOTE — Anesthesia Postprocedure Evaluation (Signed)
Anesthesia Post Note  Patient: Louis Barnett  Procedure(s) Performed: EXTENSIVE HEMORRHOIDECTOMY (N/A Rectum)  Patient location during evaluation: PACU Anesthesia Type: General Level of consciousness: awake and alert Pain management: satisfactory to patient Vital Signs Assessment: post-procedure vital signs reviewed and stable Respiratory status: spontaneous breathing Cardiovascular status: stable Postop Assessment: no apparent nausea or vomiting     Last Vitals:  Vitals:   09/03/17 1300 09/03/17 1315  BP: 120/72 (!) 143/68  Pulse: 64 64  Resp: 15 16  Temp: 36.6 C   SpO2: 100% 100%    Last Pain:  Vitals:   09/03/17 1315  TempSrc:   PainSc: 3                  [name]

## 2017-09-04 ENCOUNTER — Encounter (HOSPITAL_COMMUNITY): Payer: Self-pay | Admitting: General Surgery

## 2017-09-09 ENCOUNTER — Other Ambulatory Visit: Payer: Self-pay | Admitting: Urology

## 2017-09-09 ENCOUNTER — Encounter: Payer: Self-pay | Admitting: General Surgery

## 2017-09-09 ENCOUNTER — Ambulatory Visit (INDEPENDENT_AMBULATORY_CARE_PROVIDER_SITE_OTHER): Payer: Self-pay | Admitting: General Surgery

## 2017-09-09 VITALS — BP 136/56 | HR 70 | Temp 98.7°F | Resp 18 | Ht 68.0 in | Wt 159.0 lb

## 2017-09-09 DIAGNOSIS — K648 Other hemorrhoids: Secondary | ICD-10-CM

## 2017-09-09 DIAGNOSIS — K644 Residual hemorrhoidal skin tags: Secondary | ICD-10-CM

## 2017-09-09 MED ORDER — ROXICODONE 5 MG PO TABS: 5.0000 mg | ORAL_TABLET | ORAL | 0 refills | Status: DC | PRN

## 2017-09-09 NOTE — Progress Notes (Signed)
Left message for pam gibson patient states he had recent hemorrhoid surgery and dr Jeffie Pollock aware surgery for 09-23-17 is to be postponed

## 2017-09-09 NOTE — Patient Instructions (Signed)
Continue Sitz baths. Place a dry gauze in the area as needed to keep dampness down.  No vasoline to the area.  PRN pain medication as needed.

## 2017-09-09 NOTE — Progress Notes (Signed)
[facility] Note   HPI:  77 y.o. Male presents to clinic for post-op follow-up evaluation after an extensive hemorrhoidectomy. Patient reports he has having pain and unable to sit most of the time. He is also having some leakage of stool with urination he reports.   Review of Systems:  No fevers or chills Pain in the anal area  All other review of systems: otherwise negative   Vital Signs:  BP (!) 136/56   Pulse 70   Temp 98.7 F (37.1 C)   Resp 18   Ht 5\' 8"  (1.727 m)   Wt 159 lb (72.1 kg)   BMI 24.18 kg/m    Physical Exam:  Physical Exam  Constitutional: He is oriented to person, place, and time and well-developed, well-nourished, and in no distress.  HENT:  Head: Normocephalic.  Eyes: Pupils are equal, round, and reactive to light.  Abdominal: Soft. He exhibits no distension. There is no tenderness.  Genitourinary:  Genitourinary Comments: Swollen tissue around anus, some maceration, no redness or induration   Musculoskeletal: Normal range of motion.  Neurological: He is alert and oriented to person, place, and time.    Laboratory studies: None   Imaging:  None   Assessment:  77 y.o. yo Male s/p extensive hemorrhoidectomy. Having pain and unable to sit for long periods of time. Says he has a hemorrhoid but this is post operative swelling. Also with some drainage/ leakage.    Plan:  - Told patient all of this is expected  - Sitz baths for comfort and after BMs - No vasoline to the area, but keep area dry with gauze  - Roxi 5-10 mg q4 hr #30 reordered for pain control - The drainage/ incontinence should improve as the area heals  - See in 2 weeks   All of the above recommendations were discussed with the patient and patient's family, and all of patient's and family's questions were answered to their expressed satisfaction.  Curlene Labrum, MD [facility] [address] [place], [place] [address] [phone]  (office)

## 2017-09-23 ENCOUNTER — Ambulatory Visit: Payer: Medicare Other | Admitting: General Surgery

## 2017-09-25 ENCOUNTER — Encounter: Payer: Self-pay | Admitting: General Surgery

## 2017-09-25 ENCOUNTER — Ambulatory Visit: Payer: Medicare Other | Admitting: General Surgery

## 2017-09-25 ENCOUNTER — Ambulatory Visit (INDEPENDENT_AMBULATORY_CARE_PROVIDER_SITE_OTHER): Payer: Self-pay | Admitting: General Surgery

## 2017-09-25 VITALS — BP 109/69 | HR 83 | Temp 98.6°F | Resp 18 | Ht 68.0 in | Wt 157.0 lb

## 2017-09-25 DIAGNOSIS — K644 Residual hemorrhoidal skin tags: Secondary | ICD-10-CM

## 2017-09-25 DIAGNOSIS — K648 Other hemorrhoids: Secondary | ICD-10-CM

## 2017-09-25 NOTE — Patient Instructions (Signed)
Follow up in 4 weeks  Continue over the counter pain meds as needed. Keep stools soft.

## 2017-09-25 NOTE — Progress Notes (Signed)
[facility] Note   HPI:  77 y.o. Male presents to clinic for  follow-up evaluation of his hemorrhoidectomy site. Patient reports he has been doing better, and today he was able to ride in the car and sit for longer than before. He does feel like he has to keep his stools soft or nothing comes out. He feels like it might be tight in his anus.  Review of Systems:  No constipation Pain improved No fevers  All other review of systems: otherwise negative   Vital Signs:  BP 109/69   Pulse 83   Temp 98.6 F (37 C)   Resp 18   Ht 5\' 8"  (1.727 m)   Wt 157 lb (71.2 kg)   BMI 23.87 kg/m    Physical Exam:  Physical Exam  Constitutional: He is well-developed, well-nourished, and in no distress.  HENT:  Head: Normocephalic.  Eyes: Pupils are equal, round, and reactive to light.  Pulmonary/Chest: Effort normal.  Abdominal: Soft. There is no tenderness.  Genitourinary:  Genitourinary Comments: Well healed, minimal external skin tags, no induration, tender on rectal exam, able to perform with index finger, maybe slight tight, normal squeeze, no fissure noted     Laboratory studies: None   Imaging:  None   Assessment:  77 y.o. yo Male with some anal spasm after his hemorrhoidectomy. Does not look like the anus is stenotic but he likely still having spasm from pain and swelling.  Plan:  - Will see back in 4 weeks for recheck  - Over the counter for pain - Keep stools soft    All of the above recommendations were discussed with the patient, and all of patient's questions were answered to his expressed satisfaction.  Curlene Labrum, MD [facility] [address] [place], [place] [address] [phone] (office)

## 2017-09-30 DIAGNOSIS — N183 Chronic kidney disease, stage 3 (moderate): Secondary | ICD-10-CM | POA: Diagnosis not present

## 2017-09-30 DIAGNOSIS — K5900 Constipation, unspecified: Secondary | ICD-10-CM | POA: Diagnosis not present

## 2017-09-30 DIAGNOSIS — Z1389 Encounter for screening for other disorder: Secondary | ICD-10-CM | POA: Diagnosis not present

## 2017-09-30 DIAGNOSIS — M1991 Primary osteoarthritis, unspecified site: Secondary | ICD-10-CM | POA: Diagnosis not present

## 2017-09-30 DIAGNOSIS — E781 Pure hyperglyceridemia: Secondary | ICD-10-CM | POA: Diagnosis not present

## 2017-09-30 DIAGNOSIS — C61 Malignant neoplasm of prostate: Secondary | ICD-10-CM | POA: Diagnosis not present

## 2017-09-30 DIAGNOSIS — K648 Other hemorrhoids: Secondary | ICD-10-CM | POA: Diagnosis not present

## 2017-09-30 DIAGNOSIS — Z6823 Body mass index (BMI) 23.0-23.9, adult: Secondary | ICD-10-CM | POA: Diagnosis not present

## 2017-10-07 ENCOUNTER — Ambulatory Visit (INDEPENDENT_AMBULATORY_CARE_PROVIDER_SITE_OTHER): Payer: Self-pay | Admitting: General Surgery

## 2017-10-07 ENCOUNTER — Ambulatory Visit: Payer: Medicare Other | Admitting: General Surgery

## 2017-10-07 ENCOUNTER — Encounter: Payer: Self-pay | Admitting: General Surgery

## 2017-10-07 VITALS — BP 121/69 | HR 71 | Temp 97.5°F | Ht 68.0 in | Wt 154.0 lb

## 2017-10-07 DIAGNOSIS — Z09 Encounter for follow-up examination after completed treatment for conditions other than malignant neoplasm: Secondary | ICD-10-CM

## 2017-10-07 NOTE — Progress Notes (Signed)
Subjective:     Louis Barnett  Status post extensive hemorrhoidectomy.  Patient states that his bowel movements are softer.  He denies any constipation.  He currently has 1 out of 10 rectal pain with bowel movements.  He states that this is significantly improved.  No blood per rectum has been noted. Objective:    BP 121/69   Pulse 71   Temp (!) 97.5 F (36.4 C)   Ht 5\' 8"  (1.727 m)   Wt 154 lb (69.9 kg)   BMI 23.42 kg/m   General:  alert, cooperative and no distress  Rectal examination unremarkable.  No digital examination was performed.    Assessment:    Doing well postoperatively.    Plan:   I told the patient that he should increase the fiber in his diet to keep his bowel movements regular and with some bulk.  He will return should any problems arise.  Follow-up here as needed.  Reassured patient that he was doing well.

## 2017-10-21 DIAGNOSIS — C61 Malignant neoplasm of prostate: Secondary | ICD-10-CM | POA: Diagnosis not present

## 2017-10-27 ENCOUNTER — Telehealth: Payer: Self-pay | Admitting: *Deleted

## 2017-10-27 NOTE — Telephone Encounter (Signed)
Called patient to inform of lab appt. for 10-28-17 @ 1 pm @ [facility], lvm for a return call

## 2017-10-28 ENCOUNTER — Other Ambulatory Visit: Payer: Self-pay

## 2017-10-28 ENCOUNTER — Encounter (HOSPITAL_BASED_OUTPATIENT_CLINIC_OR_DEPARTMENT_OTHER): Payer: Self-pay | Admitting: *Deleted

## 2017-10-28 ENCOUNTER — Encounter (HOSPITAL_COMMUNITY)
Admission: RE | Admit: 2017-10-28 | Discharge: 2017-10-28 | Disposition: A | Discharge status: Home / Self Care | Payer: Medicare Other | Source: Ambulatory Visit | Attending: Urology | Admitting: Urology

## 2017-10-28 DIAGNOSIS — Z01812 Encounter for preprocedural laboratory examination: Secondary | ICD-10-CM | POA: Insufficient documentation

## 2017-10-28 LAB — COMPREHENSIVE METABOLIC PANEL
ALBUMIN: 4.4 g/dL (ref 3.5–5.0)
ALK PHOS: 57 U/L (ref 38–126)
ALT: 22 U/L (ref 17–63)
ANION GAP: 9 (ref 5–15)
AST: 23 U/L (ref 15–41)
BILIRUBIN TOTAL: 0.7 mg/dL (ref 0.3–1.2)
BUN: 30 mg/dL — ABNORMAL HIGH (ref 6–20)
CALCIUM: 9.3 mg/dL (ref 8.9–10.3)
CO2: 27 mmol/L (ref 22–32)
CREATININE: 1.18 mg/dL (ref 0.61–1.24)
Chloride: 107 mmol/L (ref 101–111)
GFR calc Af Amer: >60 mL/min (ref >60)
GFR calc non Af Amer: 58 mL/min — ABNORMAL LOW (ref >60)
Glucose, Bld: 96 mg/dL (ref 65–99)
Potassium: 4.6 mmol/L (ref 3.5–5.1)
SODIUM: 143 mmol/L (ref 135–145)
TOTAL PROTEIN: 6.9 g/dL (ref 6.5–8.1)

## 2017-10-28 LAB — APTT: APTT: 30 s (ref 24–36)

## 2017-10-28 LAB — PROTIME-INR
INR: 1.01
Prothrombin Time: 13.2 seconds (ref 11.4–15.2)

## 2017-10-28 LAB — CBC
HEMATOCRIT: 44.5 % (ref 39.0–52.0)
HEMOGLOBIN: 15.2 g/dL (ref 13.0–17.0)
MCH: 33.4 pg (ref 26.0–34.0)
MCHC: 34.2 g/dL (ref 30.0–36.0)
MCV: 97.8 fL (ref 78.0–100.0)
Platelets: 136 10*3/uL — ABNORMAL LOW (ref 150–400)
RBC: 4.55 MIL/uL (ref 4.22–5.81)
RDW: 12.5 % (ref 11.5–15.5)
WBC: 4 10*3/uL (ref 4.0–10.5)

## 2017-10-28 NOTE — Progress Notes (Addendum)
SPOKE W/ PT AND WIFE FACE TO FACE FOR PRE-OP INTERVIEW.  PT WAS HERE AT PAT APPT  FOR LAB WORK.  NPO AFTER MN.  ARRIVE AT 0600.  CURRENT EKG AND CXR IN CHART AND Epic.  WILL DO ONE FLEET ENEMA AM DOS.  INSTRUCTIONS PRINTED AND GIVEN TO PT.  ADDENDUM:  ROUTED CBC AND CMET RESULT DATED 10-28-2017 TO DR WRENN IN Epic.

## 2017-10-28 NOTE — Progress Notes (Addendum)
Your procedure is scheduled on  ___Tuesday , 04-09-2019_________  Report to [facility] AT __6:00AM___   Call this number if you have problems the morning of surgery  [phone].   OUR ADDRESS IS [place].  WE ARE LOCATED IN THE [place]                                   [facility].                                     REMEMBER:  DO NOT EAT FOOD OR DRINK LIQUIDS AFTER MIDNIGHT .  TAKE THESE MEDICATIONS MORNING OF SURGERY WITH A SIP OF WATER:  __________________________________                                    DO NOT WEAR JEWERLY, MAKE UP, OR  DO NOT WEAR LOTIONS, POWDERS, PERFUMES OR DEODORANT  TO DAY OF SURGERY. MEN MAY SHAVE FACE AND NECK. CONTACTS, GLASSES, OR DENTURES MAY NOT BE WORN TO SURGERY.                                    [facility] IS NOT RESPONSIBLE  FOR ANY BELONGINGS.                                  Special Instructions:  Do one Fleet Enema AM day of surgery.                                   Marland Kitchen

## 2017-10-29 DIAGNOSIS — C61 Malignant neoplasm of prostate: Secondary | ICD-10-CM | POA: Diagnosis not present

## 2017-11-03 ENCOUNTER — Telehealth: Payer: Self-pay | Admitting: *Deleted

## 2017-11-03 ENCOUNTER — Encounter (HOSPITAL_BASED_OUTPATIENT_CLINIC_OR_DEPARTMENT_OTHER): Payer: Self-pay | Admitting: Anesthesiology

## 2017-11-03 NOTE — Anesthesia Preprocedure Evaluation (Addendum)
Anesthesia Evaluation  Patient identified by MRN, date of birth, ID band Patient awake    Reviewed: Allergy & Precautions, NPO status   Airway Mallampati: I  TM Distance: >3 FB Neck ROM: Full    Dental no notable dental hx. (+) Teeth Intact, Caps,    Pulmonary neg pulmonary ROS,    Pulmonary exam normal breath sounds clear to auscultation       Cardiovascular + Peripheral Vascular Disease   Rhythm:Regular Rate:Normal  Hx/o splenic artery aneurysm   Neuro/Psych Hx/o TIA 2013 on plavix TIAnegative psych ROS   GI/Hepatic Neg liver ROS, GERD  Medicated and Controlled,Hx/o diverticulosis   Endo/Other  Hyperlipidemia  Renal/GU Renal diseaseHx/o renal calculi   Prostate Ca    Musculoskeletal  (+) Arthritis , Osteoarthritis,    Abdominal   Peds  Hematology Thrombocytopenia-mild Plavix- last dose 2 weeks ago   Anesthesia Other Findings   Reproductive/Obstetrics                                                           Anesthesia Evaluation  Patient identified by MRN, date of birth, ID band Patient awake    Reviewed: Allergy & Precautions, H&P , NPO status , Patient's Chart, lab work & pertinent test results  Airway Mallampati: II  TM Distance: >3 FB Neck ROM: full    Dental  (+) Caps, Dental Advisory Given,    Pulmonary neg pulmonary ROS,    Pulmonary exam normal breath sounds clear to auscultation       Cardiovascular Exercise Tolerance: Good + Peripheral Vascular Disease ( Aneurysm of splenic artery )  Normal cardiovascular exam Rhythm:regular Rate:Normal     Neuro/Psych tia 10 years ago. Resolved with plavix. TIAnegative psych ROS   GI/Hepatic Neg liver ROS, GERD  Controlled and Medicated,  Endo/Other  negative endocrine ROS  Renal/GU negative Renal ROS  negative genitourinary   Musculoskeletal   Abdominal   Peds  Hematology negative hematology  ROS (+)   Anesthesia Other Findings   Reproductive/Obstetrics negative OB ROS                             Anesthesia Physical Anesthesia Plan  ASA: III  Anesthesia Plan: General   Post-op Pain Management:    Induction: Intravenous  PONV Risk Score and Plan:   Airway Management Planned: LMA  Additional Equipment:   Intra-op Plan:   Post-operative Plan: Extubation in OR  Informed Consent: I have reviewed the patients History and Physical, chart, labs and discussed the procedure including the risks, benefits and alternatives for the proposed anesthesia with the patient or authorized representative who has indicated his/her understanding and acceptance.     Plan Discussed with:   Anesthesia Plan Comments:         Anesthesia Quick Evaluation  Anesthesia Physical Anesthesia Plan  ASA: III  Anesthesia Plan: General   Post-op Pain Management:    Induction: Intravenous  PONV Risk Score and Plan: 3 and Dexamethasone, Ondansetron and Treatment may vary due to age or medical condition  Airway Management Planned: LMA  Additional Equipment:   Intra-op Plan:   Post-operative Plan: Extubation in OR  Informed Consent: I have reviewed the patients History and Physical, chart, labs and discussed the procedure including the  risks, benefits and alternatives for the proposed anesthesia with the patient or authorized representative who has indicated his/her understanding and acceptance.   Dental advisory given  Plan Discussed with: CRNA, Anesthesiologist and Surgeon  Anesthesia Plan Comments:        Anesthesia Quick Evaluation

## 2017-11-03 NOTE — H&P (Signed)
CC/HPI: Pt presents today for pre-operative history and physical exam in anticipation of radioactive seed and space oar placement on 11/04/17 by Dr. Jeffie Pollock. He is nervous about the procedure because he still has some soreness from his hemorrhoidectomy. His has had f/u with the gen surgeon and states she was pleased with his progress. He has another appt with her prior to his procedure with Dr. Jeffie Pollock.   Pt denies F/C, HA, CP, SOB, N/V, diarrhea/constipation, back pain, flank pain, hematuria, and dysuria.    Hx:     CC: I have prostate cancer.  HPI: Louis Barnett is a 77 year-old male established patient who is here evaluation for treatment of prostate cancer.  Louis Barnett returns today for a Lupron injection. He had firmagon injection about a month ago and he has had some hot flashes. His PSA is down to 0.54 with a testosterone <10. He is voiding well with some urgency but no other complaints. He is due for a seed implant in late February and then will EXRT.   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 All of the right cores were involved and on the left there was a core with Gleason 8 disease at the base and a core with Gleason 6 disease at the apex. He had an [place] PET for staging and the only uptake was in the prostate glan. The [facility] nomogram predicts only a 9% chance of organ confined disease with a probability of SVI and LNI of about 47% each. His IPSS is 7 and his SHIM is 1. The prostate volume was 53ml.   His prostate cancer was diagnosed 06/11/2017. He does have the pathology report from his biopsy. His cancer was diagnosed by Louis Seal MD. His PSA at his time of diagnosis was 16.2. His most recent PSA is 0.54.   He has undergone Hormonal Therapy for treatment.     ALLERGIES: Penicillins - Skin Rash    MEDICATIONS: Fenofibrate 48 mg tablet Oral  Plavix 75 MG Oral Tablet Oral     Notes: [facility]   GU PSH: Cystoscopy Insert Stent - 2012 ESWL - 03/11/2016 Prostate Needle  Biopsy - 06/11/2017 Ureteroscopic stone removal - 2012, 2009      [place] Notes: hemorrhoidectomy 09/02/17     NON-GU PSH: Surgical Pathology, Gross And Microscopic Examination For Prostate Needle - 06/11/2017    GU PMH: Prostate Cancer, His PSA is falling on ADT and he has minmal symptoms. I have given Lupron 45mg  today. He will need f/u for his next injection in 6 months with a PSA. He will have a seed implant with spaceOAR on 2/29 and has a preop visit on 2/12. - 08/04/2017, He has Gleason 9 T2b N0 M0 prostate cancer with bulky involvement on the right and a very low probabilty of cure with surgical monotherapy with predicted PFS of 15% at 5 years and 7% at 10 years. I have recommended he strongly consider 2 years of ADT with EXRT with/without seed boost. He has agreed and was given firmagon 240mg . He will return in a month with a PSA and testosterone for Lupron and I will set him up to see Dr. Tammi Klippel. , - 06/23/2017 Elevated PSA, He has a PSA of 9.3 with a right prostate nodule and needs a biopsy. I will have him hold plavix for 5 days prior and have sent levaquin. Biopsy risks reviewed including bleeding, infection and voiding difficulty. I will repeat the PSA as well. - 06/02/2017 Prostate  nodule w/ LUTS - 06/02/2017 Urinary Frequency - 06/02/2017 Ureteral calculus (Acute), Left, It appears he has passed stone. No stone noted w/in bladder. CT showed stone already w/in bladder. He can f/u PRN - 04/25/2017, - 03/04/2016, Ureteral Stone, - 2014 Other Disorder Kidney/ureter, Renal cyst, native, hemorrhage - 2016 Renal calculus, Nephrolithiasis - 2016 History of urolithiasis, Nephrolithiasis - 2014 Other microscopic hematuria, Microscopic Hematuria - 2014      PMH Notes:  2011-07-15 11:16:18 - Note: Arthritis   NON-GU PMH: Encounter for general adult medical examination without abnormal findings, Encounter for preventive health examination - 2016 Aneurysm of other specified arteries, Splenic artery  aneurysm - 2014 Personal history of other diseases of the digestive system, History of esophageal reflux - 2014 Personal history of other endocrine, nutritional and metabolic disease, History of hypercholesterolemia - 2014 Personal history of transient ischemic attack (TIA), and cerebral infarction without residual deficits, History of transient cerebral ischemia - 2014    FAMILY HISTORY: Acute Myocardial Infarction - Father Death In The Family Father - Father Death In The Family Mother - Father Family Health Status Number - Runs In Family    Notes: one uncle with CaP   SOCIAL HISTORY: Marital Status: Married Preferred Language: English; Ethnicity: Not Hispanic Or Latino; Race: White Current Smoking Status: Patient has never smoked.   Tobacco Use Assessment Completed: Used Tobacco in last 30 days? Does not use smokeless tobacco. Drinks 2 drinks per year. Types of alcohol consumed: Beer.  Does not use drugs. Does not drink caffeine. Has not had a blood transfusion.    REVIEW OF SYSTEMS:    GU Review Male:   Patient reports hard to postpone urination. Patient denies frequent urination, burning/ pain with urination, get up at night to urinate, leakage of urine, stream starts and stops, trouble starting your stream, have to strain to urinate , erection problems, and penile pain.  Gastrointestinal (Upper):   Patient denies nausea, vomiting, and indigestion/ heartburn.  Gastrointestinal (Lower):   Patient denies diarrhea and constipation.  Constitutional:   Patient denies fever, night sweats, weight loss, and fatigue.  Skin:   Patient denies skin rash/ lesion and itching.  Eyes:   Patient denies blurred vision and double vision.  Ears/ Nose/ Throat:   Patient denies sore throat and sinus problems.  Hematologic/Lymphatic:   Patient denies swollen glands and easy bruising.  Cardiovascular:   Patient denies leg swelling and chest pains.  Respiratory:   Patient denies cough and shortness of  breath.  Endocrine:   Patient denies excessive thirst.  Musculoskeletal:   Patient denies back pain and joint pain.  Neurological:   Patient denies headaches and dizziness.  Psychologic:   Patient denies depression and anxiety.   VITAL SIGNS:      10/21/2017 02:45 PM  Weight 154 lb / 69.85 kg  Height 68 in / 172.72 cm  BP 146/71 mmHg  Pulse 76 /min  Temperature 98.3 F / 36.8 C  BMI 23.4 kg/m   MULTI-SYSTEM PHYSICAL EXAMINATION:    Constitutional: Well-nourished. No physical deformities. Normally developed. Good grooming.  Neck: Neck symmetrical, not swollen. Normal tracheal position.  Respiratory: Normal breath sounds. No labored breathing, no use of accessory muscles.   Cardiovascular: Regular rate and rhythm. No murmur, no gallop. Normal temperature, normal extremity pulses, no swelling, no varicosities.   Lymphatic: No enlargement of neck, axillae, groin.  Skin: No paleness, no jaundice, no cyanosis. No lesion, no ulcer, no rash.  Neurologic / Psychiatric: Oriented to time, oriented  to place, oriented to person. No depression, no anxiety, no agitation.  Gastrointestinal: No mass, no tenderness, no rigidity, non obese abdomen.  Eyes: Normal conjunctivae. Normal eyelids.  Ears, Nose, Mouth, and Throat: Left ear no scars, no lesions, no masses. Right ear no scars, no lesions, no masses. Nose no scars, no lesions, no masses. Normal hearing. Normal lips.  Musculoskeletal: Normal gait and station of head and neck.     PAST DATA REVIEWED:  Source Of History:  Patient  Records Review:   Previous Patient Records  Urine Test Review:   Urinalysis   10/21/17  Urinalysis  Urine Appearance Clear   Urine Color Yellow   Urine Glucose Neg   Urine Bilirubin Neg   Urine Ketones Neg   Urine Specific Gravity 1.025   Urine Blood Trace   Urine pH <=5.0   Urine Protein Neg   Urine Urobilinogen 0.2   Urine Nitrites Neg   Urine Leukocyte Esterase Neg   Urine WBC/hpf NS (Not Seen)   Urine  RBC/hpf 0 - 2/hpf   Urine Epithelial Cells NS (Not Seen)   Urine Bacteria NS (Not Seen)   Urine Mucous Not Present   Urine Yeast NS (Not Seen)   Urine Trichomonas Not Present   Urine Cystals NS (Not Seen)   Urine Casts NS (Not Seen)   Urine Sperm Not Present    PROCEDURES:          Urinalysis - 81003 Dipstick Dipstick Cont'd Micro  Color: Yellow Bilirubin: Neg WBC/hpf: NS (Not Seen)  Appearance: Clear Ketones: Neg RBC/hpf: 0 - 2/hpf  Specific Gravity: 1.025 Blood: Trace Bacteria: NS (Not Seen)  pH: <=5.0 Protein: Neg Cystals: NS (Not Seen)  Glucose: Neg Urobilinogen: 0.2 Casts: NS (Not Seen)    Nitrites: Neg Trichomonas: Not Present    Leukocyte Esterase: Neg Mucous: Not Present      Epithelial Cells: NS (Not Seen)      Yeast: NS (Not Seen)      Sperm: Not Present    ASSESSMENT:      ICD-10 Details  1 GU:   Prostate Cancer - C61    PLAN:           Schedule Return Visit/Planned Activity: Keep Scheduled Appointment - Schedule Surgery          Document Letter(s):  Created for Patient: Clinical Summary         Notes:   There are no changes in the patients history or physical exam since last evaluation by Dr. Jeffie Pollock. Pt is scheduled to undergo seed and space oar placement on 11/04/17. He knows to stop his Plavix 5 days in advance.   All pt's questions were answered to the best of my ability.             Next Appointment:      Next Appointment: 11/04/2017 07:30 AM    Appointment Type: Surgery     Location: [facility] [phone]    Provider: Irine Barnett, M.D.    Reason for Visit: [place]

## 2017-11-03 NOTE — Telephone Encounter (Signed)
CALLED PATIENT TO REMIND OF PROCEDURE FOR 11-04-17, SPOKE WITH PATIENT AND HE IS AWARE OF THIS PROCEDURE

## 2017-11-04 ENCOUNTER — Encounter (HOSPITAL_BASED_OUTPATIENT_CLINIC_OR_DEPARTMENT_OTHER)
Admission: RE | Disposition: A | Discharge status: Home / Self Care | Payer: Self-pay | Source: Ambulatory Visit | Attending: Urology

## 2017-11-04 ENCOUNTER — Ambulatory Visit (HOSPITAL_COMMUNITY): Payer: Medicare Other

## 2017-11-04 ENCOUNTER — Ambulatory Visit (HOSPITAL_BASED_OUTPATIENT_CLINIC_OR_DEPARTMENT_OTHER): Payer: Medicare Other | Admitting: Anesthesiology

## 2017-11-04 ENCOUNTER — Ambulatory Visit (HOSPITAL_BASED_OUTPATIENT_CLINIC_OR_DEPARTMENT_OTHER)
Admission: RE | Admit: 2017-11-04 | Discharge: 2017-11-04 | Disposition: A | Discharge status: Home / Self Care | Payer: Medicare Other | Source: Ambulatory Visit | Attending: Urology | Admitting: Urology

## 2017-11-04 ENCOUNTER — Encounter (HOSPITAL_BASED_OUTPATIENT_CLINIC_OR_DEPARTMENT_OTHER): Payer: Self-pay | Admitting: *Deleted

## 2017-11-04 DIAGNOSIS — I739 Peripheral vascular disease, unspecified: Secondary | ICD-10-CM | POA: Diagnosis not present

## 2017-11-04 DIAGNOSIS — N201 Calculus of ureter: Secondary | ICD-10-CM | POA: Diagnosis not present

## 2017-11-04 DIAGNOSIS — E785 Hyperlipidemia, unspecified: Secondary | ICD-10-CM | POA: Insufficient documentation

## 2017-11-04 DIAGNOSIS — M1990 Unspecified osteoarthritis, unspecified site: Secondary | ICD-10-CM | POA: Diagnosis not present

## 2017-11-04 DIAGNOSIS — C61 Malignant neoplasm of prostate: Secondary | ICD-10-CM | POA: Diagnosis not present

## 2017-11-04 DIAGNOSIS — K649 Unspecified hemorrhoids: Secondary | ICD-10-CM | POA: Diagnosis not present

## 2017-11-04 HISTORY — DX: Personal history of colonic polyps: Z86.010

## 2017-11-04 HISTORY — DX: Personal history of colon polyps, unspecified: Z86.0100

## 2017-11-04 HISTORY — DX: Diverticulosis of large intestine without perforation or abscess without bleeding: K57.30

## 2017-11-04 HISTORY — DX: Personal history of transient ischemic attack (TIA), and cerebral infarction without residual deficits: Z86.73

## 2017-11-04 HISTORY — PX: RADIOACTIVE SEED IMPLANT: SHX5150

## 2017-11-04 HISTORY — DX: Cyst of kidney, acquired: N28.1

## 2017-11-04 HISTORY — PX: SPACE OAR INSTILLATION: SHX6769

## 2017-11-04 HISTORY — DX: Long term (current) use of anticoagulants: Z79.01

## 2017-11-04 HISTORY — DX: Presence of external hearing-aid: Z97.4

## 2017-11-04 HISTORY — DX: Other specified diseases of anus and rectum: K62.89

## 2017-11-04 HISTORY — PX: CYSTOSCOPY: SHX5120

## 2017-11-04 SURGERY — INSERTION, RADIATION SOURCE, PROSTATE
Anesthesia: General | Site: Rectum

## 2017-11-04 MED ORDER — CIPROFLOXACIN IN D5W 400 MG/200ML IV SOLN
400.0000 mg | INTRAVENOUS | Status: AC
  Administered 2017-11-04: 400 mg via INTRAVENOUS
  Filled 2017-11-04: qty 200

## 2017-11-04 MED ORDER — OXYCODONE HCL 5 MG PO TABS
5.0000 mg | ORAL_TABLET | ORAL | Status: DC | PRN
  Filled 2017-11-04: qty 2

## 2017-11-04 MED ORDER — ACETAMINOPHEN 325 MG PO TABS
650.0000 mg | ORAL_TABLET | ORAL | Status: DC | PRN
  Filled 2017-11-04: qty 2

## 2017-11-04 MED ORDER — HYDROCODONE-ACETAMINOPHEN 7.5-325 MG PO TABS
1.0000 | ORAL_TABLET | Freq: Once | ORAL | Status: DC | PRN
  Filled 2017-11-04: qty 1

## 2017-11-04 MED ORDER — KETOROLAC TROMETHAMINE 15 MG/ML IJ SOLN
INTRAMUSCULAR | Status: DC | PRN
  Administered 2017-11-04: 15 mg via INTRAVENOUS

## 2017-11-04 MED ORDER — LIDOCAINE 2% (20 MG/ML) 5 ML SYRINGE
INTRAMUSCULAR | Status: AC
  Filled 2017-11-04: qty 5

## 2017-11-04 MED ORDER — KETOROLAC TROMETHAMINE 30 MG/ML IJ SOLN
INTRAMUSCULAR | Status: AC
  Filled 2017-11-04: qty 1

## 2017-11-04 MED ORDER — EPHEDRINE SULFATE 50 MG/ML IJ SOLN
INTRAMUSCULAR | Status: DC | PRN
  Administered 2017-11-04: 10 mg via INTRAVENOUS

## 2017-11-04 MED ORDER — FENTANYL CITRATE (PF) 100 MCG/2ML IJ SOLN
25.0000 ug | INTRAMUSCULAR | Status: DC | PRN
  Filled 2017-11-04: qty 1

## 2017-11-04 MED ORDER — PROPOFOL 10 MG/ML IV BOLUS
INTRAVENOUS | Status: AC
  Filled 2017-11-04: qty 40

## 2017-11-04 MED ORDER — PROMETHAZINE HCL 25 MG/ML IJ SOLN
6.2500 mg | INTRAMUSCULAR | Status: DC | PRN
  Filled 2017-11-04: qty 1

## 2017-11-04 MED ORDER — SODIUM CHLORIDE 0.9% FLUSH
3.0000 mL | Freq: Two times a day (BID) | INTRAVENOUS | Status: DC
  Filled 2017-11-04: qty 3

## 2017-11-04 MED ORDER — SODIUM CHLORIDE 0.9 % IJ SOLN
INTRAMUSCULAR | Status: DC | PRN
  Administered 2017-11-04: 10 mL

## 2017-11-04 MED ORDER — TRAMADOL HCL 50 MG PO TABS
50.0000 mg | ORAL_TABLET | Freq: Four times a day (QID) | ORAL | Status: DC | PRN
  Administered 2017-11-04: 50 mg via ORAL
  Filled 2017-11-04: qty 1

## 2017-11-04 MED ORDER — DIPHENHYDRAMINE HCL 50 MG/ML IJ SOLN
INTRAMUSCULAR | Status: AC
  Filled 2017-11-04: qty 1

## 2017-11-04 MED ORDER — MORPHINE SULFATE (PF) 2 MG/ML IV SOLN
1.0000 mg | INTRAVENOUS | Status: DC | PRN
  Filled 2017-11-04: qty 0.5

## 2017-11-04 MED ORDER — DEXAMETHASONE SODIUM PHOSPHATE 4 MG/ML IJ SOLN
INTRAMUSCULAR | Status: DC | PRN
  Administered 2017-11-04: 10 mg via INTRAVENOUS

## 2017-11-04 MED ORDER — FENTANYL CITRATE (PF) 100 MCG/2ML IJ SOLN
INTRAMUSCULAR | Status: DC | PRN
  Administered 2017-11-04: 50 ug via INTRAVENOUS
  Administered 2017-11-04 (×2): 25 ug via INTRAVENOUS

## 2017-11-04 MED ORDER — EPHEDRINE 5 MG/ML INJ
INTRAVENOUS | Status: AC
  Filled 2017-11-04: qty 10

## 2017-11-04 MED ORDER — ACETAMINOPHEN 650 MG RE SUPP
650.0000 mg | RECTAL | Status: DC | PRN
  Filled 2017-11-04: qty 1

## 2017-11-04 MED ORDER — LACTATED RINGERS IV SOLN
INTRAVENOUS | Status: DC
  Administered 2017-11-04 (×2): via INTRAVENOUS
  Filled 2017-11-04: qty 1000

## 2017-11-04 MED ORDER — PROPOFOL 10 MG/ML IV BOLUS
INTRAVENOUS | Status: DC | PRN
Start: 2017-11-04 — End: 2017-11-04
  Administered 2017-11-04: 150 mg via INTRAVENOUS

## 2017-11-04 MED ORDER — ONDANSETRON HCL 4 MG/2ML IJ SOLN
INTRAMUSCULAR | Status: DC | PRN
  Administered 2017-11-04: 4 mg via INTRAVENOUS

## 2017-11-04 MED ORDER — SODIUM CHLORIDE 0.9 % IR SOLN
Status: DC | PRN
  Administered 2017-11-04: 1000 mL

## 2017-11-04 MED ORDER — FLEET ENEMA 7-19 GM/118ML RE ENEM
1.0000 | ENEMA | Freq: Once | RECTAL | Status: DC
  Filled 2017-11-04: qty 1

## 2017-11-04 MED ORDER — FENTANYL CITRATE (PF) 100 MCG/2ML IJ SOLN
INTRAMUSCULAR | Status: AC
  Filled 2017-11-04: qty 2

## 2017-11-04 MED ORDER — ONDANSETRON HCL 4 MG/2ML IJ SOLN
INTRAMUSCULAR | Status: AC
  Filled 2017-11-04: qty 2

## 2017-11-04 MED ORDER — LIDOCAINE HCL (CARDIAC) 20 MG/ML IV SOLN
INTRAVENOUS | Status: DC | PRN
  Administered 2017-11-04: 60 mg via INTRAVENOUS

## 2017-11-04 MED ORDER — TRAMADOL HCL 50 MG PO TABS: 50.0000 mg | ORAL_TABLET | Freq: Four times a day (QID) | ORAL | 0 refills | Status: AC | PRN

## 2017-11-04 MED ORDER — MEPERIDINE HCL 25 MG/ML IJ SOLN
6.2500 mg | INTRAMUSCULAR | Status: DC | PRN
  Filled 2017-11-04: qty 1

## 2017-11-04 MED ORDER — DIPHENHYDRAMINE HCL 50 MG/ML IJ SOLN
INTRAMUSCULAR | Status: DC | PRN
  Administered 2017-11-04: 12.5 mg via INTRAVENOUS

## 2017-11-04 MED ORDER — TRAMADOL HCL 50 MG PO TABS
ORAL_TABLET | ORAL | Status: AC
  Filled 2017-11-04: qty 1

## 2017-11-04 MED ORDER — DEXAMETHASONE SODIUM PHOSPHATE 10 MG/ML IJ SOLN
INTRAMUSCULAR | Status: AC
  Filled 2017-11-04: qty 1

## 2017-11-04 MED ORDER — SODIUM CHLORIDE 0.9% FLUSH
3.0000 mL | INTRAVENOUS | Status: DC | PRN
  Filled 2017-11-04: qty 3

## 2017-11-04 MED ORDER — CIPROFLOXACIN IN D5W 400 MG/200ML IV SOLN
INTRAVENOUS | Status: AC
  Filled 2017-11-04: qty 200

## 2017-11-04 MED ORDER — IOHEXOL 300 MG/ML  SOLN
INTRAMUSCULAR | Status: DC | PRN
  Administered 2017-11-04: 7 mL

## 2017-11-04 MED ORDER — SODIUM CHLORIDE 0.9 % IV SOLN
250.0000 mL | INTRAVENOUS | Status: DC | PRN
Start: 2017-11-04 — End: 2017-11-04
  Filled 2017-11-04: qty 250

## 2017-11-04 SURGICAL SUPPLY — 37 items
BAG URINE DRAINAGE (UROLOGICAL SUPPLIES) ×5 IMPLANT
BLADE CLIPPER SURG (BLADE) ×5 IMPLANT
CATH FOLEY 2WAY SLVR  5CC 16FR (CATHETERS) ×2
CATH FOLEY 2WAY SLVR 5CC 16FR (CATHETERS) ×3 IMPLANT
CATH ROBINSON RED A/P 16FR (CATHETERS) IMPLANT
CATH ROBINSON RED A/P 20FR (CATHETERS) ×5 IMPLANT
CLOTH BEACON ORANGE TIMEOUT ST (SAFETY) ×5 IMPLANT
COVER BACK TABLE 60X90IN (DRAPES) ×5 IMPLANT
COVER MAYO STAND STRL (DRAPES) ×5 IMPLANT
DRSG TEGADERM 4X4.75 (GAUZE/BANDAGES/DRESSINGS) ×8 IMPLANT
DRSG TEGADERM 8X12 (GAUZE/BANDAGES/DRESSINGS) ×8 IMPLANT
GAUZE SPONGE 4X4 12PLY STRL (GAUZE/BANDAGES/DRESSINGS) ×2 IMPLANT
GLOVE BIO SURGEON STRL SZ 6.5 (GLOVE) ×1 IMPLANT
GLOVE BIO SURGEONS STRL SZ 6.5 (GLOVE) ×1
GLOVE BIOGEL PI IND STRL 6.5 (GLOVE) IMPLANT
GLOVE BIOGEL PI IND STRL 7.5 (GLOVE) IMPLANT
GLOVE BIOGEL PI INDICATOR 6.5 (GLOVE) ×4
GLOVE BIOGEL PI INDICATOR 7.5 (GLOVE) ×2
GLOVE ECLIPSE 8.0 STRL XLNG CF (GLOVE) ×5 IMPLANT
GLOVE SURG SS PI 8.0 STRL IVOR (GLOVE) ×10 IMPLANT
GOWN STRL REUS W/ TWL XL LVL3 (GOWN DISPOSABLE) ×3 IMPLANT
GOWN STRL REUS W/TWL LRG LVL3 (GOWN DISPOSABLE) ×2 IMPLANT
GOWN STRL REUS W/TWL XL LVL3 (GOWN DISPOSABLE) ×5 IMPLANT
HOLDER FOLEY CATH W/STRAP (MISCELLANEOUS) ×3 IMPLANT
IMPL SPACEOAR SYSTEM 10ML (MISCELLANEOUS) ×3 IMPLANT
IMPLANT SPACEOAR SYSTEM 10ML (MISCELLANEOUS) ×5
IV NS 1000ML (IV SOLUTION) ×5
IV NS 1000ML BAXH (IV SOLUTION) ×3 IMPLANT
KIT TURNOVER CYSTO (KITS) ×5 IMPLANT
PACK CYSTO (CUSTOM PROCEDURE TRAY) ×5 IMPLANT
SELECTSEED I-125 ×2 IMPLANT
SURGILUBE 2OZ TUBE FLIPTOP (MISCELLANEOUS) IMPLANT
SUT BONE WAX W31G (SUTURE) ×5 IMPLANT
SYRINGE 10CC LL (SYRINGE) IMPLANT
UNDERPAD 30X30 (UNDERPADS AND DIAPERS) ×10 IMPLANT
WATER STERILE IRR 3000ML UROMA (IV SOLUTION) ×3 IMPLANT
WATER STERILE IRR 500ML POUR (IV SOLUTION) ×5 IMPLANT

## 2017-11-04 NOTE — Anesthesia Procedure Notes (Signed)
Procedure Name: LMA Insertion Date/Time: 11/04/2017 8:31 AM Performed by: Justice Rocher, CRNA Pre-anesthesia Checklist: Patient identified, Emergency Drugs available, Suction available and Patient being monitored Patient Re-evaluated:Patient Re-evaluated prior to induction Oxygen Delivery Method: Circle system utilized Preoxygenation: Pre-oxygenation with 100% oxygen Induction Type: IV induction Ventilation: Mask ventilation without difficulty LMA: LMA inserted LMA Size: 4.0 Number of attempts: 1 Airway Equipment and Method: Bite block Placement Confirmation: positive ETCO2 and breath sounds checked- equal and bilateral Tube secured with: Tape Dental Injury: Teeth and Oropharynx as per pre-operative assessment

## 2017-11-04 NOTE — Interval H&P Note (Signed)
History and Physical Interval Note:   He continues to have some issues with hemorrhoids but I discussed the needle placement and how it should avoid aggravating the hemorrhoids.  11/04/2017 7:16 AM  Louis Barnett  has presented today for surgery, with the diagnosis of PROSTATE CANCER  The various methods of treatment have been discussed with the patient and family. After consideration of risks, benefits and other options for treatment, the patient has consented to  Procedure(s): RADIOACTIVE SEED IMPLANT/BRACHYTHERAPY IMPLANT (N/A) SPACE OAR INSTILLATION (N/A) as a surgical intervention .  The patient's history has been reviewed, patient examined, no change in status, stable for surgery.  I have reviewed the patient's chart and labs.  Questions were answered to the patient's satisfaction.     Irine Seal

## 2017-11-04 NOTE — Op Note (Addendum)
PATIENT:  Fransisco Beau  PRE-OPERATIVE DIAGNOSIS:  Adenocarcinoma of the prostate  POST-OPERATIVE DIAGNOSIS:  Same  PROCEDURE:  Procedure(s): 1. I-125 radioactive seed implantation 2. SpaceOAR implant.  3. Cystoscopy  SURGEON:  Surgeon(s): Irine Seal MD  Radiation oncologist: Dr. Tyler Pita  ANESTHESIA:  General  EBL:  Minimal  DRAINS: 90 French Foley catheter  INDICATION: SIRUS LABRIE is a 77 y.o. with Stage T2b, Gleason 9(4+5)  prostate cancer who has elected brachytherapy for treatment in combination with ADT and EXRT.  Description of procedure: After informed consent the patient was brought to the major OR, placed on the table and administered general anesthesia. He was then moved to the modified lithotomy position with his perineum perpendicular to the floor. His perineum and genitalia were then sterilely prepped. An official timeout was then performed. A 16 French Foley catheter was then placed in the bladder and filled with dilute contrast, a rectal tube was placed in the rectum and the transrectal ultrasound probe was placed in the rectum and affixed to the stand. He was then sterilely draped.  The sterile grid was installed.   Anchor needles were then placed.   Real time ultrasonography was used along with the seed planning software spot-pro version 3.1-00. This was used to develop the seed plan including the number of needles as well as number of seeds required for complete and adequate coverage. Real-time ultrasonography was then used along with the previously developed plan and the Nucletron device to implant a total of 49 seeds using 15 needles for a target dose of 145 Gy. This proceeded without difficulty or complication.  The seed guide was removed and the SpaceOAR was prepared.  The spinal needle was inserted in the midline under US guidance and placed in the fat plane posterior to the prostate.  Sterile water was puffed into the space confirming proper placement.  The  gel components were then injected over 10 sec with excellent distribution.     The Foley catheter was then removed as well as the transrectal ultrasound probe and rectal catheter.  Flexible cystoscopy was then performed using the 17 French flexible scope which revealed a normal urethra throughout its length down to the sphincter which appeared intact. The prostatic urethra was 2cm with bilobar hyperplasia with minimal obstruction. The bladder was then entered and fully and systematically inspected.  The ureteral orifices were noted to be of normal configuration and position. The mucosa revealed no evidence of tumors. There were also no stones identified within the bladder.  No seeds or spacers were seen and/or removed from the bladder.  The cystoscope was then removed.  The drapes were removed.  The perineum was cleaned and dressed.  He was taken out of the lithotomy position and was awakened and taken to recovery room in stable and satisfactory condition. He tolerated procedure well and there were no intraoperative complications.

## 2017-11-04 NOTE — Discharge Instructions (Addendum)
Brachytherapy for Prostate Cancer, Care After Refer to this sheet in the next few weeks. These instructions provide you with information on caring for yourself after your procedure. Your health care provider may also give you more specific instructions. Your treatment has been planned according to current medical practices, but problems sometimes occur. Call your health care provider if you have any problems or questions after your procedure. What can I expect after the procedure? The area behind the scrotum will probably be tender and bruised. For a short period of time you may have:  Difficulty passing urine. You may need a catheter for a few days to a month.  Blood in the urine or semen.  A feeling of constipation because of prostate swelling.  Frequent feeling of an urgent need to urinate.  For a long period of time you may have:  Inflammation of the rectum. This happens in about 2% of people who have the procedure.  Erection problems. These vary with age and occur in about 15-40% of men.  Difficulty urinating. This is caused by scarring in the urethra.  Diarrhea.  Follow these instructions at home:  Take medicines only as directed by your health care provider.  You will probably have a catheter in your bladder for several days. You will have blood in the urine bag and should drink a lot of fluids to keep it a light red color.  Keep all follow-up visits as directed by your health care provider. If you have a catheter, it will be removed during one of these visits.  Try not to sit directly on the area behind the scrotum. A soft cushion can decrease the discomfort. Ice packs may also be helpful for the discomfort. Do not put ice directly on the skin.  Shower and wash the area behind the scrotum gently. Do not sit in a tub.  If you have had the brachytherapy that uses the seeds, limit your close contact with children and pregnant women for 2 months because of the radiation still  in the prostate. After that period of time, the levels drop off quickly. Get help right away if:  You have a fever.  You have chills.  You have shortness of breath.  You have chest pain.  You have thick blood, like tomato juice, in the urine bag.  Your catheter is blocked so urine cannot get into the bag. Your bladder area or lower abdomen may be swollen.  There is excessive bleeding from your rectum. It is normal to have a little blood mixed with your stool.  There is severe discomfort in the treated area that does not go away with pain medicine.  You have abdominal discomfort.  You have severe nausea or vomiting.  You develop any new or unusual symptoms. This information is not intended to replace advice given to you by your health care provider. Make sure you discuss any questions you have with your health care provider.  Do not take any nonsteroidal anti inflammatories( Advil, Ibuprofen, Aleve, Motrin) until after 3:40 pm today. Document Released: 08/17/2010 Document Revised: 12/27/2015 Document Reviewed: 01/05/2013 Elsevier Interactive Patient Education  2017 [place] [facility] Instructions  Activity: Get plenty of rest for the remainder of the day. A responsible individual must stay with you for 24 hours following the procedure.  For the next 24 hours, DO NOT: -Drive a car -Paediatric nurse -Drink alcoholic beverages -Take any medication unless instructed by your physician -Make  any legal decisions or sign important papers.  Meals: Start with liquid foods such as gelatin or soup. Progress to regular foods as tolerated. Avoid greasy, spicy, heavy foods. If nausea and/or vomiting occur, drink only clear liquids until the nausea and/or vomiting subsides. Call your physician if vomiting continues.  Special Instructions/Symptoms: Your throat may feel dry or sore from the anesthesia or the breathing tube placed in your throat during  surgery. If this causes discomfort, gargle with warm salt water. The discomfort should disappear within 24 hours.  If you had a scopolamine patch placed behind your ear for the management of post- operative nausea and/or vomiting:  1. The medication in the patch is effective for 72 hours, after which it should be removed.  Wrap patch in a tissue and discard in the trash. Wash hands thoroughly with soap and water. 2. You may remove the patch earlier than 72 hours if you experience unpleasant side effects which may include dry mouth, dizziness or visual disturbances. 3. Avoid touching the patch. Wash your hands with soap and water after contact with the patch.

## 2017-11-04 NOTE — Transfer of Care (Signed)
Immediate Anesthesia Transfer of Care Note  Patient: Louis Barnett  Procedure(s) Performed: Procedure(s) (LRB): RADIOACTIVE SEED IMPLANT/BRACHYTHERAPY IMPLANT (N/A) SPACE OAR INSTILLATION (N/A) CYSTOSCOPY FLEXIBLE (N/A)  Patient Location: PACU  Anesthesia Type: General  Level of Consciousness: awake, sedated, patient cooperative and responds to stimulation  Airway & Oxygen Therapy: Patient Spontanous Breathing and Patient connected to [place] O2  Post-op Assessment: Report given to PACU RN, Post -op Vital signs reviewed and stable and Patient moving all extremities  Post vital signs: Reviewed and stable  Complications: No apparent anesthesia complications

## 2017-11-04 NOTE — Anesthesia Postprocedure Evaluation (Signed)
Anesthesia Post Note  Patient: Louis Barnett  Procedure(s) Performed: RADIOACTIVE SEED IMPLANT/BRACHYTHERAPY IMPLANT (N/A Prostate) SPACE OAR INSTILLATION (N/A Rectum) CYSTOSCOPY FLEXIBLE (N/A Bladder)     Patient location during evaluation: PACU Anesthesia Type: General Level of consciousness: awake and alert and oriented Pain management: pain level controlled Vital Signs Assessment: post-procedure vital signs reviewed and stable Respiratory status: spontaneous breathing, nonlabored ventilation and respiratory function stable Cardiovascular status: blood pressure returned to baseline and stable Postop Assessment: no apparent nausea or vomiting Anesthetic complications: no    Last Vitals:  Vitals:   11/04/17 1030 11/04/17 1031  BP:  (!) 151/72  Pulse: 81 78  Resp: 14 12  Temp:    SpO2: 99% 100%    Last Pain:  Vitals:   11/04/17 1031  TempSrc:   PainSc: 3                  Maxden Naji A.

## 2017-11-05 ENCOUNTER — Encounter (HOSPITAL_BASED_OUTPATIENT_CLINIC_OR_DEPARTMENT_OTHER): Payer: Self-pay | Admitting: Urology

## 2017-11-05 NOTE — Progress Notes (Signed)
  Radiation Oncology         [phone] ________________________________  Name: Louis Barnett MRN: [id number]  Date: 11/05/2017  DOB: 1941/03/16       Prostate Seed Implant  PI:RJJOA, Purcell Nails, MD  No ref. provider found  DIAGNOSIS: 77 y.o. 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    ICD-10-CM   1. Prostate cancer ([place]) C61 DG Chest 2 View    DG Chest 2 View    PROCEDURE: Insertion of radioactive I-125 seeds into the prostate gland.  RADIATION DOSE: 110 Gy, boost therapy.  TECHNIQUE: Louis Barnett was brought to the operating room with the urologist. He was placed in the dorsolithotomy position. He was catheterized and a rectal tube was inserted. The perineum was shaved, prepped and draped. The ultrasound probe was then introduced into the rectum to see the prostate gland.  TREATMENT DEVICE: A needle grid was attached to the ultrasound probe stand and anchor needles were placed.  3D PLANNING: The prostate was imaged in 3D using a sagittal sweep of the prostate probe. These images were transferred to the planning computer. There, the prostate, urethra and rectum were defined on each axial reconstructed image. Then, the software created an optimized 3D plan and a few seed positions were adjusted. The quality of the plan was reviewed using [facility] information for the target and the following two organs at risk:  Urethra and Rectum.  Then the accepted plan was uploaded to the seed Selectron afterloading unit.  PROSTATE VOLUME STUDY:  Using transrectal ultrasound the volume of the prostate was verified to be 18.88 cc.  SPECIAL TREATMENT PROCEDURE/SUPERVISION AND HANDLING: The Nucletron FIRST system was used to place the needles under sagittal guidance. A total of 16 needles were used to deposit 49 seeds in the prostate gland. The individual seed activity was 0.291 mCi.  SpaceOAR:  Yes  COMPLEX SIMULATION: At the end of the procedure,  an anterior radiograph of the pelvis was obtained to document seed positioning and count. Cystoscopy was performed to check the urethra and bladder.  MICRODOSIMETRY: At the end of the procedure, the patient was emitting 0.11 mR/hr at 1 meter. Accordingly, he was considered safe for hospital discharge.  PLAN: The patient will return to the radiation oncology clinic for post implant CT dosimetry in three weeks.   ________________________________  Sheral Apley Tammi Klippel, M.D.

## 2017-11-06 ENCOUNTER — Telehealth: Payer: Self-pay | Admitting: Radiation Oncology

## 2017-11-06 NOTE — Telephone Encounter (Signed)
Phoned both the patient's cell and home phone to inquire further. No answer at either. Left a message with my contact information requesting he call back.

## 2017-11-06 NOTE — Telephone Encounter (Signed)
-----   Message from Freeman Caldron, [place] sent at 11/06/2017 10:59 AM EDT ----- Regarding: FW: PHONE CALL Sam, Will you call Mr. Yoss to see if this sounds like typical "post-seed" sxs (just had brachytherapy 11/04/17) or if any concern for UTI.  Also, please make sure that he is emptying his bladder well on voiding and not just dribbling urine- to r/o urinary retention.  If any concern for urinary retention (I.e. subrapubic discomfort, severe urgency with small, dribbly voids, etc.), advise him to call [facility] to be seen by APP as a work-in today.  If concerns for UTI, we can have him come drop off a urine sample for u/a and culture. Lia Foyer ----- Message ----- From: Kerri Perches Sent: 11/06/2017   9:23 AM To: Freeman Caldron, PA-C Subject: PHONE CALL                                     Hi Ashlyn,   Would you please call the above patient, he had an implant and space oar on 11-04-17, he said that he is urinating a lot and frequently.  Mr. Furukawa phone number is [phone].  Thanks,  [employer]

## 2017-11-07 ENCOUNTER — Telehealth: Payer: Self-pay | Admitting: Radiation Oncology

## 2017-11-07 ENCOUNTER — Other Ambulatory Visit: Payer: Self-pay | Admitting: Urology

## 2017-11-07 MED ORDER — TAMSULOSIN HCL 0.4 MG PO CAPS: 0.4000 mg | ORAL_CAPSULE | Freq: Every day | ORAL | 5 refills | Status: DC

## 2017-11-07 NOTE — Telephone Encounter (Signed)
Phoned patient again to inquire about needs. Patient reports urinary frequency, urgency and a "tiny bit of suprapubic pain 2 on a scale of 0-10." Denies dysuria or hematuria. Denies fever. Reports a normal stream. Denies hesitancy. Denies urinary leakage. Explained that per [employer], PA-C these are all normal side effects we would expect him to have three days after prostate seed placement. Explained Ashlyn has escribed [facility] [place]. Reviewed directions for use with patient. He verbalized understanding. [facility]oned [facility] and left a detailed message with the prescription since its the weekend in the event the pharmacy didn't get the electronic version.

## 2017-11-07 NOTE — Telephone Encounter (Signed)
-----   Message from Freeman Caldron, [place] sent at 11/06/2017 10:59 AM EDT ----- Regarding: FW: PHONE CALL Sam, Will you call Mr. Isadore to see if this sounds like typical "post-seed" sxs (just had brachytherapy 11/04/17) or if any concern for UTI.  Also, please make sure that he is emptying his bladder well on voiding and not just dribbling urine- to r/o urinary retention.  If any concern for urinary retention (I.e. subrapubic discomfort, severe urgency with small, dribbly voids, etc.), advise him to call [facility] to be seen by APP as a work-in today.  If concerns for UTI, we can have him come drop off a urine sample for u/a and culture. Lia Foyer ----- Message ----- From: Kerri Perches Sent: 11/06/2017   9:23 AM To: Freeman Caldron, PA-C Subject: PHONE CALL                                     Hi Ashlyn,   Would you please call the above patient, he had an implant and space oar on 11-04-17, he said that he is urinating a lot and frequently.  Mr. Higginbotham phone number is [phone].  Thanks,  [employer]

## 2017-11-19 ENCOUNTER — Telehealth: Payer: Self-pay | Admitting: *Deleted

## 2017-11-19 NOTE — Telephone Encounter (Signed)
CALLED PATIENT TO REMIND OF POST SEED APPT. FOR 11-20-17 AND HIS MRI FOR 11-20-17 - ARRIVAL TIME - 3:30 PM @ [facility], NO RESTRICTIONS TO TEST, LVM FOR A RETURN CALL

## 2017-11-20 ENCOUNTER — Ambulatory Visit
Admission: RE | Admit: 2017-11-20 | Discharge: 2017-11-20 | Disposition: A | Discharge status: Home / Self Care | Payer: Medicare Other | Source: Ambulatory Visit | Attending: Radiation Oncology | Admitting: Radiation Oncology

## 2017-11-20 ENCOUNTER — Ambulatory Visit: Payer: Medicare Other | Admitting: Radiation Oncology

## 2017-11-20 ENCOUNTER — Ambulatory Visit (HOSPITAL_COMMUNITY)
Admission: RE | Admit: 2017-11-20 | Discharge: 2017-11-20 | Disposition: A | Discharge status: Home / Self Care | Payer: Medicare Other | Source: Ambulatory Visit | Attending: Urology | Admitting: Urology

## 2017-11-20 DIAGNOSIS — C61 Malignant neoplasm of prostate: Secondary | ICD-10-CM

## 2017-11-20 DIAGNOSIS — Z510 Encounter for antineoplastic radiation therapy: Secondary | ICD-10-CM | POA: Diagnosis not present

## 2017-11-20 NOTE — Progress Notes (Signed)
  Radiation Oncology         [phone]) [phone] ________________________________  Name: Louis Barnett MRN: [id number]  Date: 11/20/2017  DOB: 10-08-1940  COMPLEX SIMULATION NOTE  NARRATIVE:  The patient was brought to the [facility] today following prostate seed implantation approximately one month ago.  Identity was confirmed.  All relevant records and images related to the planned course of therapy were reviewed.  Then, the patient was set-up supine.  CT images were obtained.  The CT images were loaded into the planning software.  Then the prostate and rectum were contoured.  Treatment planning then occurred.  The implanted iodine 125 seeds were identified by the physics staff for projection of radiation distribution  I have requested : 3D Simulation  I have requested a DVH of the following structures: Prostate and rectum.    ________________________________  Sheral Apley Tammi Klippel, M.D.  This document serves as a record of services personally performed by Tyler Pita, MD. It was created on his behalf by Rae Lips, a trained medical scribe. The creation of this record is based on the scribe's personal observations and the provider's statements to them. This document has been checked and approved by the attending provider.

## 2017-11-21 ENCOUNTER — Ambulatory Visit: Payer: Medicare Other | Admitting: Radiation Oncology

## 2017-11-21 NOTE — Progress Notes (Signed)
  Radiation Oncology         [phone]) [phone] ________________________________  Name: Louis Barnett MRN: [id number]  Date: 11/20/2017  DOB: 1940/11/28  SIMULATION AND TREATMENT PLANNING NOTE    ICD-10-CM   1. Malignant neoplasm of prostate ([place]) C61     DIAGNOSIS:  77 y.o. 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  NARRATIVE:  The patient was brought to the [facility].  Identity was confirmed.  All relevant records and images related to the planned course of therapy were reviewed.  The patient freely provided informed written consent to proceed with treatment after reviewing the details related to the planned course of therapy. The consent form was witnessed and verified by the simulation staff.  Then, the patient was set-up in a stable reproducible supine position for radiation therapy.  A vacuum lock pillow device was custom fabricated to position his legs in a reproducible immobilized position.  Then, I performed a urethrogram under sterile conditions to identify the prostatic apex.  CT images were obtained.  Surface markings were placed.  The CT images were loaded into the planning software.  Then the prostate target and avoidance structures including the rectum, bladder, bowel and hips were contoured.  Treatment planning then occurred.  The radiation prescription was entered and confirmed.  A total of one complex treatment devices were fabricated. I have requested : Intensity Modulated Radiotherapy (IMRT) is medically necessary for this case for the following reason:  Rectal sparing.Marland Kitchen  PLAN:  The patient will receive 45 Gy in 25 fractions of 1.8 Gy, to supplement an up-front prostate seed implant boost of 110 Gy to achieve a total nominal dose of 165 Gy.  ________________________________  Sheral Apley Tammi Klippel, M.D.

## 2017-11-21 NOTE — Addendum Note (Signed)
Encounter addended by: Tyler Pita, MD on: 11/21/2017 7:29 PM  Actions taken: Sign clinical note

## 2017-11-25 DIAGNOSIS — C61 Malignant neoplasm of prostate: Secondary | ICD-10-CM | POA: Diagnosis not present

## 2017-11-26 DIAGNOSIS — C61 Malignant neoplasm of prostate: Secondary | ICD-10-CM | POA: Diagnosis not present

## 2017-11-26 DIAGNOSIS — Z510 Encounter for antineoplastic radiation therapy: Secondary | ICD-10-CM | POA: Insufficient documentation

## 2017-12-01 ENCOUNTER — Ambulatory Visit
Admission: RE | Admit: 2017-12-01 | Discharge: 2017-12-01 | Disposition: A | Discharge status: Home / Self Care | Payer: Medicare Other | Source: Ambulatory Visit | Attending: Radiation Oncology | Admitting: Radiation Oncology

## 2017-12-01 ENCOUNTER — Encounter: Payer: Self-pay | Admitting: Medical Oncology

## 2017-12-01 DIAGNOSIS — C61 Malignant neoplasm of prostate: Secondary | ICD-10-CM | POA: Diagnosis not present

## 2017-12-01 DIAGNOSIS — Z510 Encounter for antineoplastic radiation therapy: Secondary | ICD-10-CM | POA: Diagnosis not present

## 2017-12-02 ENCOUNTER — Ambulatory Visit
Admission: RE | Admit: 2017-12-02 | Discharge: 2017-12-02 | Disposition: A | Discharge status: Home / Self Care | Payer: Medicare Other | Source: Ambulatory Visit | Attending: Radiation Oncology | Admitting: Radiation Oncology

## 2017-12-02 DIAGNOSIS — C61 Malignant neoplasm of prostate: Secondary | ICD-10-CM | POA: Diagnosis not present

## 2017-12-02 DIAGNOSIS — Z510 Encounter for antineoplastic radiation therapy: Secondary | ICD-10-CM | POA: Diagnosis not present

## 2017-12-03 ENCOUNTER — Ambulatory Visit
Admission: RE | Admit: 2017-12-03 | Discharge: 2017-12-03 | Disposition: A | Discharge status: Home / Self Care | Payer: Medicare Other | Source: Ambulatory Visit | Attending: Radiation Oncology | Admitting: Radiation Oncology

## 2017-12-03 DIAGNOSIS — Z510 Encounter for antineoplastic radiation therapy: Secondary | ICD-10-CM | POA: Diagnosis not present

## 2017-12-03 DIAGNOSIS — C61 Malignant neoplasm of prostate: Secondary | ICD-10-CM | POA: Diagnosis not present

## 2017-12-04 ENCOUNTER — Ambulatory Visit
Admission: RE | Admit: 2017-12-04 | Discharge: 2017-12-04 | Disposition: A | Discharge status: Home / Self Care | Payer: Medicare Other | Source: Ambulatory Visit | Attending: Radiation Oncology | Admitting: Radiation Oncology

## 2017-12-04 DIAGNOSIS — C61 Malignant neoplasm of prostate: Secondary | ICD-10-CM | POA: Diagnosis not present

## 2017-12-04 DIAGNOSIS — Z510 Encounter for antineoplastic radiation therapy: Secondary | ICD-10-CM | POA: Diagnosis not present

## 2017-12-05 ENCOUNTER — Ambulatory Visit
Admission: RE | Admit: 2017-12-05 | Discharge: 2017-12-05 | Disposition: A | Discharge status: Home / Self Care | Payer: Medicare Other | Source: Ambulatory Visit | Attending: Radiation Oncology | Admitting: Radiation Oncology

## 2017-12-05 DIAGNOSIS — Z510 Encounter for antineoplastic radiation therapy: Secondary | ICD-10-CM | POA: Diagnosis not present

## 2017-12-05 DIAGNOSIS — C61 Malignant neoplasm of prostate: Secondary | ICD-10-CM | POA: Diagnosis not present

## 2017-12-08 ENCOUNTER — Ambulatory Visit
Admission: RE | Admit: 2017-12-08 | Discharge: 2017-12-08 | Disposition: A | Discharge status: Home / Self Care | Payer: Medicare Other | Source: Ambulatory Visit | Attending: Radiation Oncology | Admitting: Radiation Oncology

## 2017-12-08 DIAGNOSIS — Z510 Encounter for antineoplastic radiation therapy: Secondary | ICD-10-CM | POA: Diagnosis not present

## 2017-12-08 DIAGNOSIS — C61 Malignant neoplasm of prostate: Secondary | ICD-10-CM | POA: Diagnosis not present

## 2017-12-09 ENCOUNTER — Ambulatory Visit
Admission: RE | Admit: 2017-12-09 | Discharge: 2017-12-09 | Disposition: A | Discharge status: Home / Self Care | Payer: Medicare Other | Source: Ambulatory Visit | Attending: Radiation Oncology | Admitting: Radiation Oncology

## 2017-12-09 DIAGNOSIS — C61 Malignant neoplasm of prostate: Secondary | ICD-10-CM | POA: Diagnosis not present

## 2017-12-09 DIAGNOSIS — Z510 Encounter for antineoplastic radiation therapy: Secondary | ICD-10-CM | POA: Diagnosis not present

## 2017-12-10 ENCOUNTER — Ambulatory Visit
Admission: RE | Admit: 2017-12-10 | Discharge: 2017-12-10 | Disposition: A | Discharge status: Home / Self Care | Payer: Medicare Other | Source: Ambulatory Visit | Attending: Radiation Oncology | Admitting: Radiation Oncology

## 2017-12-10 DIAGNOSIS — C61 Malignant neoplasm of prostate: Secondary | ICD-10-CM | POA: Diagnosis not present

## 2017-12-10 DIAGNOSIS — Z510 Encounter for antineoplastic radiation therapy: Secondary | ICD-10-CM | POA: Diagnosis not present

## 2017-12-11 ENCOUNTER — Ambulatory Visit
Admission: RE | Admit: 2017-12-11 | Discharge: 2017-12-11 | Disposition: A | Discharge status: Home / Self Care | Payer: Medicare Other | Source: Ambulatory Visit | Attending: Radiation Oncology | Admitting: Radiation Oncology

## 2017-12-11 DIAGNOSIS — Z510 Encounter for antineoplastic radiation therapy: Secondary | ICD-10-CM | POA: Diagnosis not present

## 2017-12-11 DIAGNOSIS — C61 Malignant neoplasm of prostate: Secondary | ICD-10-CM | POA: Diagnosis not present

## 2017-12-12 ENCOUNTER — Other Ambulatory Visit: Payer: Self-pay | Admitting: Radiation Oncology

## 2017-12-12 ENCOUNTER — Ambulatory Visit
Admission: RE | Admit: 2017-12-12 | Discharge: 2017-12-12 | Disposition: A | Discharge status: Home / Self Care | Payer: Medicare Other | Source: Ambulatory Visit | Attending: Radiation Oncology | Admitting: Radiation Oncology

## 2017-12-12 DIAGNOSIS — Z510 Encounter for antineoplastic radiation therapy: Secondary | ICD-10-CM | POA: Diagnosis not present

## 2017-12-12 DIAGNOSIS — C61 Malignant neoplasm of prostate: Secondary | ICD-10-CM | POA: Diagnosis not present

## 2017-12-12 MED ORDER — HYDROCORTISONE 2.5 % RE CREA: 1.0000 "application " | TOPICAL_CREAM | Freq: Two times a day (BID) | RECTAL | 0 refills | Status: AC

## 2017-12-15 ENCOUNTER — Ambulatory Visit
Admission: RE | Admit: 2017-12-15 | Discharge: 2017-12-15 | Disposition: A | Discharge status: Home / Self Care | Payer: Medicare Other | Source: Ambulatory Visit | Attending: Radiation Oncology | Admitting: Radiation Oncology

## 2017-12-15 DIAGNOSIS — Z510 Encounter for antineoplastic radiation therapy: Secondary | ICD-10-CM | POA: Diagnosis not present

## 2017-12-15 DIAGNOSIS — C61 Malignant neoplasm of prostate: Secondary | ICD-10-CM | POA: Diagnosis not present

## 2017-12-16 ENCOUNTER — Ambulatory Visit
Admission: RE | Admit: 2017-12-16 | Discharge: 2017-12-16 | Disposition: A | Discharge status: Home / Self Care | Payer: Medicare Other | Source: Ambulatory Visit | Attending: Radiation Oncology | Admitting: Radiation Oncology

## 2017-12-16 DIAGNOSIS — C61 Malignant neoplasm of prostate: Secondary | ICD-10-CM | POA: Diagnosis not present

## 2017-12-16 DIAGNOSIS — Z510 Encounter for antineoplastic radiation therapy: Secondary | ICD-10-CM | POA: Diagnosis not present

## 2017-12-17 ENCOUNTER — Ambulatory Visit
Admission: RE | Admit: 2017-12-17 | Discharge: 2017-12-17 | Disposition: A | Discharge status: Home / Self Care | Payer: Medicare Other | Source: Ambulatory Visit | Attending: Radiation Oncology | Admitting: Radiation Oncology

## 2017-12-17 DIAGNOSIS — C61 Malignant neoplasm of prostate: Secondary | ICD-10-CM | POA: Diagnosis not present

## 2017-12-17 DIAGNOSIS — Z510 Encounter for antineoplastic radiation therapy: Secondary | ICD-10-CM | POA: Diagnosis not present

## 2017-12-18 ENCOUNTER — Ambulatory Visit
Admission: RE | Admit: 2017-12-18 | Discharge: 2017-12-18 | Disposition: A | Discharge status: Home / Self Care | Payer: Medicare Other | Source: Ambulatory Visit | Attending: Radiation Oncology | Admitting: Radiation Oncology

## 2017-12-18 DIAGNOSIS — Z510 Encounter for antineoplastic radiation therapy: Secondary | ICD-10-CM | POA: Diagnosis not present

## 2017-12-18 DIAGNOSIS — C61 Malignant neoplasm of prostate: Secondary | ICD-10-CM | POA: Diagnosis not present

## 2017-12-19 ENCOUNTER — Ambulatory Visit
Admission: RE | Admit: 2017-12-19 | Discharge: 2017-12-19 | Disposition: A | Discharge status: Home / Self Care | Payer: Medicare Other | Source: Ambulatory Visit | Attending: Radiation Oncology | Admitting: Radiation Oncology

## 2017-12-19 DIAGNOSIS — C61 Malignant neoplasm of prostate: Secondary | ICD-10-CM | POA: Diagnosis not present

## 2017-12-19 DIAGNOSIS — Z510 Encounter for antineoplastic radiation therapy: Secondary | ICD-10-CM | POA: Diagnosis not present

## 2017-12-23 ENCOUNTER — Ambulatory Visit
Admission: RE | Admit: 2017-12-23 | Discharge: 2017-12-23 | Disposition: A | Discharge status: Home / Self Care | Payer: Medicare Other | Source: Ambulatory Visit | Attending: Radiation Oncology | Admitting: Radiation Oncology

## 2017-12-23 DIAGNOSIS — Z510 Encounter for antineoplastic radiation therapy: Secondary | ICD-10-CM | POA: Diagnosis not present

## 2017-12-23 DIAGNOSIS — C61 Malignant neoplasm of prostate: Secondary | ICD-10-CM | POA: Diagnosis not present

## 2017-12-24 ENCOUNTER — Ambulatory Visit
Admission: RE | Admit: 2017-12-24 | Discharge: 2017-12-24 | Disposition: A | Discharge status: Home / Self Care | Payer: Medicare Other | Source: Ambulatory Visit | Attending: Radiation Oncology | Admitting: Radiation Oncology

## 2017-12-24 DIAGNOSIS — Z510 Encounter for antineoplastic radiation therapy: Secondary | ICD-10-CM | POA: Diagnosis not present

## 2017-12-24 DIAGNOSIS — C61 Malignant neoplasm of prostate: Secondary | ICD-10-CM | POA: Diagnosis not present

## 2017-12-25 ENCOUNTER — Ambulatory Visit
Admission: RE | Admit: 2017-12-25 | Discharge: 2017-12-25 | Disposition: A | Discharge status: Home / Self Care | Payer: Medicare Other | Source: Ambulatory Visit | Attending: Radiation Oncology | Admitting: Radiation Oncology

## 2017-12-25 ENCOUNTER — Encounter: Payer: Self-pay | Admitting: Radiation Oncology

## 2017-12-25 DIAGNOSIS — C61 Malignant neoplasm of prostate: Secondary | ICD-10-CM | POA: Diagnosis not present

## 2017-12-25 DIAGNOSIS — Z510 Encounter for antineoplastic radiation therapy: Secondary | ICD-10-CM | POA: Diagnosis not present

## 2017-12-26 ENCOUNTER — Ambulatory Visit
Admission: RE | Admit: 2017-12-26 | Discharge: 2017-12-26 | Disposition: A | Discharge status: Home / Self Care | Payer: Medicare Other | Source: Ambulatory Visit | Attending: Radiation Oncology | Admitting: Radiation Oncology

## 2017-12-26 DIAGNOSIS — C61 Malignant neoplasm of prostate: Secondary | ICD-10-CM | POA: Diagnosis not present

## 2017-12-26 DIAGNOSIS — Z510 Encounter for antineoplastic radiation therapy: Secondary | ICD-10-CM | POA: Diagnosis not present

## 2017-12-29 ENCOUNTER — Ambulatory Visit
Admission: RE | Admit: 2017-12-29 | Discharge: 2017-12-29 | Disposition: A | Discharge status: Home / Self Care | Payer: Medicare Other | Source: Ambulatory Visit | Attending: Radiation Oncology | Admitting: Radiation Oncology

## 2017-12-29 DIAGNOSIS — C61 Malignant neoplasm of prostate: Secondary | ICD-10-CM | POA: Diagnosis not present

## 2017-12-29 DIAGNOSIS — Z510 Encounter for antineoplastic radiation therapy: Secondary | ICD-10-CM | POA: Diagnosis not present

## 2017-12-30 ENCOUNTER — Ambulatory Visit
Admission: RE | Admit: 2017-12-30 | Discharge: 2017-12-30 | Disposition: A | Discharge status: Home / Self Care | Payer: Medicare Other | Source: Ambulatory Visit | Attending: Radiation Oncology | Admitting: Radiation Oncology

## 2017-12-30 DIAGNOSIS — Z510 Encounter for antineoplastic radiation therapy: Secondary | ICD-10-CM | POA: Diagnosis not present

## 2017-12-30 DIAGNOSIS — C61 Malignant neoplasm of prostate: Secondary | ICD-10-CM | POA: Diagnosis not present

## 2017-12-31 ENCOUNTER — Ambulatory Visit
Admission: RE | Admit: 2017-12-31 | Discharge: 2017-12-31 | Disposition: A | Discharge status: Home / Self Care | Payer: Medicare Other | Source: Ambulatory Visit | Attending: Radiation Oncology | Admitting: Radiation Oncology

## 2017-12-31 DIAGNOSIS — Z510 Encounter for antineoplastic radiation therapy: Secondary | ICD-10-CM | POA: Diagnosis not present

## 2017-12-31 DIAGNOSIS — C61 Malignant neoplasm of prostate: Secondary | ICD-10-CM | POA: Diagnosis not present

## 2018-01-01 ENCOUNTER — Ambulatory Visit
Admission: RE | Admit: 2018-01-01 | Discharge: 2018-01-01 | Disposition: A | Discharge status: Home / Self Care | Payer: Medicare Other | Source: Ambulatory Visit | Attending: Radiation Oncology | Admitting: Radiation Oncology

## 2018-01-01 DIAGNOSIS — C61 Malignant neoplasm of prostate: Secondary | ICD-10-CM | POA: Diagnosis not present

## 2018-01-01 DIAGNOSIS — Z510 Encounter for antineoplastic radiation therapy: Secondary | ICD-10-CM | POA: Diagnosis not present

## 2018-01-02 ENCOUNTER — Ambulatory Visit
Admission: RE | Admit: 2018-01-02 | Discharge: 2018-01-02 | Disposition: A | Discharge status: Home / Self Care | Payer: Medicare Other | Source: Ambulatory Visit | Attending: Radiation Oncology | Admitting: Radiation Oncology

## 2018-01-02 DIAGNOSIS — Z510 Encounter for antineoplastic radiation therapy: Secondary | ICD-10-CM | POA: Diagnosis not present

## 2018-01-02 DIAGNOSIS — C61 Malignant neoplasm of prostate: Secondary | ICD-10-CM | POA: Diagnosis not present

## 2018-01-05 ENCOUNTER — Ambulatory Visit
Admission: RE | Admit: 2018-01-05 | Discharge: 2018-01-05 | Disposition: A | Discharge status: Home / Self Care | Payer: Medicare Other | Source: Ambulatory Visit | Attending: Radiation Oncology | Admitting: Radiation Oncology

## 2018-01-05 DIAGNOSIS — Z510 Encounter for antineoplastic radiation therapy: Secondary | ICD-10-CM | POA: Diagnosis not present

## 2018-01-05 DIAGNOSIS — C61 Malignant neoplasm of prostate: Secondary | ICD-10-CM | POA: Diagnosis not present

## 2018-01-09 ENCOUNTER — Encounter: Payer: Self-pay | Admitting: Radiation Oncology

## 2018-01-09 NOTE — Progress Notes (Signed)
  Radiation Oncology         [phone]) [phone] ________________________________  Name: Louis Barnett MRN: [id number]  Date: 01/09/2018  DOB: 06-22-1941  End of Treatment Note  Diagnosis:   Stage T2bN0M0 adenocarcinoma of the prostate with Gleason Score of 5+4, and PSA of 16.20.     Indication for treatment:  Curative, Definitive Radiotherapy following an up-front seed boost on 11/04/17   Radiation treatment dates:   12/01/2017 - 01/05/2018  Site/dose:   The prostate was treated to 45 Gy in 25 fractions of 1.8 Gy  Beams/energy:   The patient was treated with IMRT using volumetric arc therapy delivering 6 MV X-rays to clockwise and counterclockwise circumferential arcs with a 90 degree collimator offset to avoid dose scalloping.  Image guidance was performed with daily cone beam CT prior to each fraction to align to gold markers in the prostate and assure proper bladder and rectal fill volumes.  Immobilization was achieved with BodyFix custom mold.  Narrative: The patient tolerated radiation treatment relatively well.   Heexperienced some minor urinary irritation and modest fatigue.  At the beginning he reported burning to his rectum, but was unsure if it was related to radiation or his prior hemorrhoid surgery. Throughout treatment, he experienced mild fatigue, loose stool, post void dribble, weak stream, occasional feelings of incomplete emptying- mostly at night, intermittency, nocturia q hour/night, mild dysuria, and increased frequency/urgency, but denied gross hematuria or incontinence.   Plan: The patient has completed radiation treatment. He will return to radiation oncology clinic for routine followup in one month. I advised him to call or return sooner if he has any questions or concerns related to his recovery or treatment. ________________________________  Sheral Apley. Tammi Klippel, M.D.   This document serves as a record of services personally performed by Tyler Pita, MD. It was created on his  behalf by Margit Banda, a trained medical scribe. The creation of this record is based on the scribe's personal observations and the provider's statements to them. This document has been checked and approved by the attending provider.

## 2018-01-10 NOTE — Progress Notes (Signed)
  Radiation Oncology         [phone]) [phone] ________________________________  Name: RASUL DECOLA MRN: [id number]  Date: 12/25/2017  DOB: Jan 01, 1941  3D Planning Note   Prostate Brachytherapy Post-Implant Dosimetry  Diagnosis: 77 y.o. gentleman with Stage T2bN0M0 adenocarcinoma of the prostate with Gleason Score of 5+4, and PSA of 16.20  Narrative: On a previous date, TYRON MANETTA returned following prostate seed implantation for post implant planning. He underwent CT scan complex simulation to delineate the three-dimensional structures of the pelvis and demonstrate the radiation distribution.  Since that time, the seed localization, and complex isodose planning with dose volume histograms have now been completed.  Results:   Prostate Coverage - The dose of radiation delivered to the 90% or more of the prostate gland (D90) was 97.83% of the prescription dose. This exceeds our goal of greater than 90%. Rectal Sparing - The volume of rectal tissue receiving the prescription dose or higher was 0.0 cc. This falls under our thresholds tolerance of 1.0 cc.  Impression: The prostate seed implant appears to show adequate target coverage and appropriate rectal sparing.  Plan:  The patient will continue to follow with urology for ongoing PSA determinations. I would anticipate a high likelihood for local tumor control with minimal risk for rectal morbidity.  ________________________________  Sheral Apley Tammi Klippel, M.D.

## 2018-02-03 DIAGNOSIS — C61 Malignant neoplasm of prostate: Secondary | ICD-10-CM | POA: Diagnosis not present

## 2018-02-10 ENCOUNTER — Encounter: Payer: Self-pay | Admitting: Urology

## 2018-02-10 ENCOUNTER — Ambulatory Visit
Admission: RE | Admit: 2018-02-10 | Discharge: 2018-02-10 | Disposition: A | Discharge status: Home / Self Care | Payer: Medicare Other | Source: Ambulatory Visit | Attending: Urology | Admitting: Urology

## 2018-02-10 VITALS — BP 125/57 | HR 75 | Temp 98.3°F | Resp 16 | Ht 68.0 in | Wt 157.8 lb

## 2018-02-10 DIAGNOSIS — Z923 Personal history of irradiation: Secondary | ICD-10-CM | POA: Diagnosis not present

## 2018-02-10 DIAGNOSIS — Z7902 Long term (current) use of antithrombotics/antiplatelets: Secondary | ICD-10-CM | POA: Insufficient documentation

## 2018-02-10 DIAGNOSIS — Z79899 Other long term (current) drug therapy: Secondary | ICD-10-CM | POA: Diagnosis not present

## 2018-02-10 DIAGNOSIS — Z880 Allergy status to penicillin: Secondary | ICD-10-CM | POA: Insufficient documentation

## 2018-02-10 DIAGNOSIS — C61 Malignant neoplasm of prostate: Secondary | ICD-10-CM | POA: Insufficient documentation

## 2018-02-10 DIAGNOSIS — Z08 Encounter for follow-up examination after completed treatment for malignant neoplasm: Secondary | ICD-10-CM | POA: Insufficient documentation

## 2018-02-10 DIAGNOSIS — Z7982 Long term (current) use of aspirin: Secondary | ICD-10-CM | POA: Diagnosis not present

## 2018-02-10 NOTE — Progress Notes (Signed)
Radiation Oncology         [phone] ________________________________  Name: DARREL GLOSS MRN: [id number]  Date: 02/10/2018  DOB: 05-01-1941  Post Treatment Note  CC: Redmond School, MD  Irine Seal, MD  Diagnosis:   Stage T2bN0M0 adenocarcinoma of the prostate with Gleason Score of 5+4, and PSA of 16.20.    Interval Since Last Radiation:  5 weeks s/p Curative, Definitive Radiotherapy following an up-front seed boost on 11/04/17.    12/01/2017 - 01/05/2018:   The prostate was treated to 45 Gy in 25 fractions of 1.8 Gy  11/04/17: Insertion of radioactive I-125 seeds into the prostate gland; 110 Gy, boost therapy and placement of SpaceOAR gel.  Narrative:  The patient returns today for routine follow-up. He tolerated radiation treatment relatively well.   He experienced some minor urinary irritation and modest fatigue.  At the beginning he reported burning to his rectum, but was unsure if it was related to radiation or his prior hemorrhoid surgery. Throughout treatment, he experienced mild fatigue, loose stool, post void dribble, weak stream, occasional feelings of incomplete emptying- mostly at night, intermittency, nocturia q hour/night, mild dysuria, and increased frequency/urgency, but denied gross hematuria or incontinence.                          On review of systems, the patient states that he is doing well overall.  He continues to notice gradual improvement in his lower urinary tract symptoms.  His current IPSS is 19 indicating moderate urinary symptoms with persistent urgency and frequency but again feels that these are improving.  He specifically denies gross hematuria, dysuria, weak stream or incontinence.  On occasion, he might strain to empty his bladder but as a general rule, he feels that he empties his bladder well on voiding.  He denies abdominal pain, nausea, vomiting or diarrhea.  He has continued on androgen deprivation therapy with Lupron which he tolerates well.  He continues  with mild fatigue but overall is quite pleased with his progress to date.  ALLERGIES:  is allergic to ivp dye [iodinated diagnostic agents] and penicillins.  Meds: Current Outpatient Medications  Medication Sig Dispense Refill  . Ascorbic Acid (VITAMIN C PO) Take by mouth daily.    . clopidogrel (PLAVIX) 75 MG tablet Take 75 mg by mouth daily.     . Ibuprofen-Diphenhydramine HCl (ADVIL PM) 200-25 MG CAPS Take 2 tablets by mouth at bedtime as needed (sleep).    . tamsulosin (FLOMAX) 0.4 MG CAPS capsule Take 1 capsule (0.4 mg total) by mouth daily after supper. To decrease urinary frequency and urgency. 30 capsule 5  . Aspirin Effervescent (ALKA-SELTZER PO) Take 1 tablet by mouth daily as needed (indigestion).    Marland Kitchen docusate sodium (COLACE) 100 MG capsule Take 100-300 mg by mouth 2 (two) times daily.    . Thiamine HCl (B-1 PO) Take by mouth daily.    . traMADol (ULTRAM) 50 MG tablet Take 1 tablet (50 mg total) by mouth every 6 (six) hours as needed. (Patient not taking: Reported on 02/10/2018) 6 tablet 0   No current facility-administered medications for this encounter.     Physical Findings:  height is 5\' 8"  (1.727 m) and weight is 157 lb 12.8 oz (71.6 kg). His oral temperature is 98.3 F (36.8 C). His blood pressure is 125/57 (abnormal) and his pulse is 75. His respiration is 16 and oxygen saturation is 100%.  Pain Assessment Pain Score: 0-No  pain/10 In general this is a well appearing Caucasian male in no acute distress.  He's alert and oriented x4 and appropriate throughout the examination. Cardiopulmonary assessment is negative for acute distress and he exhibits normal effort.   Lab Findings: Lab Results  Component Value Date   WBC 4.0 10/28/2017   HGB 15.2 10/28/2017   HCT 44.5 10/28/2017   MCV 97.8 10/28/2017   PLT 136 (L) 10/28/2017     Radiographic Findings: No results found.  Impression/Plan: 1. Stage T2bN0M0 adenocarcinoma of the prostate with Gleason Score of 5+4, and  PSA of 16.20.   He will continue to follow up with urology for ongoing PSA determinations and has an appointment scheduled with Dr. Jeffie Pollock on 02/11/18 for repeat Lupron injection.  He anticipates continuing ADT for a total of 2 years.  He understands what to expect with regards to PSA monitoring going forward. I will look forward to following his response to treatment via correspondence with urology, and would be happy to continue to participate in his care if clinically indicated. I talked to the patient about what to expect in the future, including his risk for erectile dysfunction and rectal bleeding. I encouraged him to call or return to the office if he has any questions regarding his previous radiation or possible radiation side effects. He was comfortable with this plan and will follow up as needed.    Nicholos Johns, PA-C

## 2018-02-11 DIAGNOSIS — C61 Malignant neoplasm of prostate: Secondary | ICD-10-CM | POA: Diagnosis not present

## 2018-02-11 DIAGNOSIS — N403 Nodular prostate with lower urinary tract symptoms: Secondary | ICD-10-CM | POA: Diagnosis not present

## 2018-02-11 DIAGNOSIS — R350 Frequency of micturition: Secondary | ICD-10-CM | POA: Diagnosis not present

## 2018-02-16 ENCOUNTER — Other Ambulatory Visit: Payer: Self-pay | Admitting: Urology

## 2018-02-16 DIAGNOSIS — Z79818 Long term (current) use of other agents affecting estrogen receptors and estrogen levels: Secondary | ICD-10-CM

## 2018-02-23 DIAGNOSIS — M25561 Pain in right knee: Secondary | ICD-10-CM | POA: Diagnosis not present

## 2018-02-23 DIAGNOSIS — M1711 Unilateral primary osteoarthritis, right knee: Secondary | ICD-10-CM | POA: Diagnosis not present

## 2018-03-02 DIAGNOSIS — M1711 Unilateral primary osteoarthritis, right knee: Secondary | ICD-10-CM | POA: Diagnosis not present

## 2018-03-02 DIAGNOSIS — M25561 Pain in right knee: Secondary | ICD-10-CM | POA: Diagnosis not present

## 2018-03-10 DIAGNOSIS — M25561 Pain in right knee: Secondary | ICD-10-CM | POA: Diagnosis not present

## 2018-03-10 DIAGNOSIS — M1711 Unilateral primary osteoarthritis, right knee: Secondary | ICD-10-CM | POA: Diagnosis not present

## 2018-03-26 ENCOUNTER — Ambulatory Visit (INDEPENDENT_AMBULATORY_CARE_PROVIDER_SITE_OTHER): Payer: Medicare Other | Admitting: Otolaryngology

## 2018-04-03 ENCOUNTER — Other Ambulatory Visit: Payer: Self-pay | Admitting: Urology

## 2018-04-03 ENCOUNTER — Other Ambulatory Visit: Payer: Medicare Other

## 2018-04-07 ENCOUNTER — Ambulatory Visit
Admission: RE | Admit: 2018-04-07 | Discharge: 2018-04-07 | Disposition: A | Discharge status: Home / Self Care | Payer: Medicare Other | Source: Ambulatory Visit | Attending: Urology | Admitting: Urology

## 2018-04-07 DIAGNOSIS — Z13820 Encounter for screening for osteoporosis: Secondary | ICD-10-CM | POA: Diagnosis not present

## 2018-04-07 DIAGNOSIS — Z79818 Long term (current) use of other agents affecting estrogen receptors and estrogen levels: Secondary | ICD-10-CM

## 2018-04-08 DIAGNOSIS — M25561 Pain in right knee: Secondary | ICD-10-CM | POA: Diagnosis not present

## 2018-04-08 DIAGNOSIS — M1711 Unilateral primary osteoarthritis, right knee: Secondary | ICD-10-CM | POA: Diagnosis not present

## 2018-04-13 ENCOUNTER — Ambulatory Visit (INDEPENDENT_AMBULATORY_CARE_PROVIDER_SITE_OTHER): Payer: Medicare Other | Admitting: Otolaryngology

## 2018-04-13 DIAGNOSIS — H838X3 Other specified diseases of inner ear, bilateral: Secondary | ICD-10-CM | POA: Diagnosis not present

## 2018-04-13 DIAGNOSIS — H903 Sensorineural hearing loss, bilateral: Secondary | ICD-10-CM

## 2018-04-22 DIAGNOSIS — M25561 Pain in right knee: Secondary | ICD-10-CM | POA: Diagnosis not present

## 2018-04-22 DIAGNOSIS — M1711 Unilateral primary osteoarthritis, right knee: Secondary | ICD-10-CM | POA: Diagnosis not present

## 2018-05-05 ENCOUNTER — Other Ambulatory Visit: Payer: Medicare Other

## 2018-06-01 DIAGNOSIS — D485 Neoplasm of uncertain behavior of skin: Secondary | ICD-10-CM | POA: Diagnosis not present

## 2018-06-01 DIAGNOSIS — X32XXXD Exposure to sunlight, subsequent encounter: Secondary | ICD-10-CM | POA: Diagnosis not present

## 2018-06-01 DIAGNOSIS — D225 Melanocytic nevi of trunk: Secondary | ICD-10-CM | POA: Diagnosis not present

## 2018-06-01 DIAGNOSIS — L570 Actinic keratosis: Secondary | ICD-10-CM | POA: Diagnosis not present

## 2018-06-01 DIAGNOSIS — L818 Other specified disorders of pigmentation: Secondary | ICD-10-CM | POA: Diagnosis not present

## 2018-06-01 DIAGNOSIS — I781 Nevus, non-neoplastic: Secondary | ICD-10-CM | POA: Diagnosis not present

## 2018-06-06 DIAGNOSIS — L089 Local infection of the skin and subcutaneous tissue, unspecified: Secondary | ICD-10-CM | POA: Diagnosis not present

## 2018-06-19 DIAGNOSIS — Z23 Encounter for immunization: Secondary | ICD-10-CM | POA: Diagnosis not present

## 2018-07-02 DIAGNOSIS — Z0000 Encounter for general adult medical examination without abnormal findings: Secondary | ICD-10-CM | POA: Diagnosis not present

## 2018-08-07 DIAGNOSIS — Z8546 Personal history of malignant neoplasm of prostate: Secondary | ICD-10-CM | POA: Diagnosis not present

## 2018-08-12 DIAGNOSIS — N403 Nodular prostate with lower urinary tract symptoms: Secondary | ICD-10-CM | POA: Diagnosis not present

## 2018-08-12 DIAGNOSIS — C61 Malignant neoplasm of prostate: Secondary | ICD-10-CM | POA: Diagnosis not present

## 2018-08-12 DIAGNOSIS — R350 Frequency of micturition: Secondary | ICD-10-CM | POA: Diagnosis not present

## 2018-08-12 DIAGNOSIS — Z5111 Encounter for antineoplastic chemotherapy: Secondary | ICD-10-CM | POA: Diagnosis not present

## 2019-01-13 DIAGNOSIS — Z0000 Encounter for general adult medical examination without abnormal findings: Secondary | ICD-10-CM | POA: Diagnosis not present

## 2019-01-13 DIAGNOSIS — E785 Hyperlipidemia, unspecified: Secondary | ICD-10-CM | POA: Diagnosis not present

## 2019-01-13 DIAGNOSIS — Z681 Body mass index (BMI) 19 or less, adult: Secondary | ICD-10-CM | POA: Diagnosis not present

## 2019-01-13 DIAGNOSIS — Z1389 Encounter for screening for other disorder: Secondary | ICD-10-CM | POA: Diagnosis not present

## 2019-02-08 DIAGNOSIS — Z8546 Personal history of malignant neoplasm of prostate: Secondary | ICD-10-CM | POA: Diagnosis not present

## 2019-02-15 DIAGNOSIS — R232 Flushing: Secondary | ICD-10-CM | POA: Diagnosis not present

## 2019-02-15 DIAGNOSIS — R351 Nocturia: Secondary | ICD-10-CM | POA: Diagnosis not present

## 2019-02-15 DIAGNOSIS — C61 Malignant neoplasm of prostate: Secondary | ICD-10-CM | POA: Diagnosis not present

## 2019-02-15 DIAGNOSIS — N403 Nodular prostate with lower urinary tract symptoms: Secondary | ICD-10-CM | POA: Diagnosis not present

## 2019-04-19 ENCOUNTER — Ambulatory Visit (INDEPENDENT_AMBULATORY_CARE_PROVIDER_SITE_OTHER): Payer: Medicare Other | Admitting: Otolaryngology

## 2019-04-24 DIAGNOSIS — Z23 Encounter for immunization: Secondary | ICD-10-CM | POA: Diagnosis not present

## 2019-05-24 ENCOUNTER — Ambulatory Visit (INDEPENDENT_AMBULATORY_CARE_PROVIDER_SITE_OTHER): Payer: Medicare Other | Admitting: Otolaryngology

## 2019-07-07 DIAGNOSIS — L820 Inflamed seborrheic keratosis: Secondary | ICD-10-CM | POA: Diagnosis not present

## 2019-07-07 DIAGNOSIS — X32XXXD Exposure to sunlight, subsequent encounter: Secondary | ICD-10-CM | POA: Diagnosis not present

## 2019-07-07 DIAGNOSIS — L570 Actinic keratosis: Secondary | ICD-10-CM | POA: Diagnosis not present

## 2019-09-09 DIAGNOSIS — Z23 Encounter for immunization: Secondary | ICD-10-CM | POA: Diagnosis not present

## 2019-09-10 DIAGNOSIS — M7021 Olecranon bursitis, right elbow: Secondary | ICD-10-CM | POA: Diagnosis not present

## 2019-09-15 DIAGNOSIS — Z8546 Personal history of malignant neoplasm of prostate: Secondary | ICD-10-CM | POA: Diagnosis not present

## 2019-09-20 DIAGNOSIS — Z8546 Personal history of malignant neoplasm of prostate: Secondary | ICD-10-CM | POA: Diagnosis not present

## 2019-09-20 DIAGNOSIS — N403 Nodular prostate with lower urinary tract symptoms: Secondary | ICD-10-CM | POA: Diagnosis not present

## 2019-09-20 DIAGNOSIS — R3912 Poor urinary stream: Secondary | ICD-10-CM | POA: Diagnosis not present

## 2019-10-08 DIAGNOSIS — Z23 Encounter for immunization: Secondary | ICD-10-CM | POA: Diagnosis not present

## 2019-10-29 DIAGNOSIS — M7021 Olecranon bursitis, right elbow: Secondary | ICD-10-CM | POA: Diagnosis not present

## 2019-10-29 DIAGNOSIS — M25421 Effusion, right elbow: Secondary | ICD-10-CM | POA: Diagnosis not present

## 2019-12-13 DIAGNOSIS — Z8546 Personal history of malignant neoplasm of prostate: Secondary | ICD-10-CM | POA: Diagnosis not present

## 2019-12-20 DIAGNOSIS — Z8546 Personal history of malignant neoplasm of prostate: Secondary | ICD-10-CM | POA: Diagnosis not present

## 2020-01-18 DIAGNOSIS — G8929 Other chronic pain: Secondary | ICD-10-CM | POA: Diagnosis not present

## 2020-01-18 DIAGNOSIS — M25561 Pain in right knee: Secondary | ICD-10-CM | POA: Diagnosis not present

## 2020-01-18 DIAGNOSIS — M1711 Unilateral primary osteoarthritis, right knee: Secondary | ICD-10-CM | POA: Diagnosis not present

## 2020-01-24 DIAGNOSIS — M79675 Pain in left toe(s): Secondary | ICD-10-CM | POA: Diagnosis not present

## 2020-01-24 DIAGNOSIS — L600 Ingrowing nail: Secondary | ICD-10-CM | POA: Diagnosis not present

## 2020-02-29 DIAGNOSIS — M1711 Unilateral primary osteoarthritis, right knee: Secondary | ICD-10-CM | POA: Diagnosis not present

## 2020-03-05 DIAGNOSIS — J0100 Acute maxillary sinusitis, unspecified: Secondary | ICD-10-CM | POA: Diagnosis not present

## 2020-03-05 DIAGNOSIS — R0981 Nasal congestion: Secondary | ICD-10-CM | POA: Diagnosis not present

## 2020-03-20 DIAGNOSIS — N403 Nodular prostate with lower urinary tract symptoms: Secondary | ICD-10-CM | POA: Diagnosis not present

## 2020-03-20 DIAGNOSIS — R351 Nocturia: Secondary | ICD-10-CM | POA: Diagnosis not present

## 2020-03-20 DIAGNOSIS — Z8546 Personal history of malignant neoplasm of prostate: Secondary | ICD-10-CM | POA: Diagnosis not present

## 2020-03-20 DIAGNOSIS — E349 Endocrine disorder, unspecified: Secondary | ICD-10-CM | POA: Diagnosis not present

## 2020-03-22 DIAGNOSIS — C44329 Squamous cell carcinoma of skin of other parts of face: Secondary | ICD-10-CM | POA: Diagnosis not present

## 2020-04-13 DIAGNOSIS — D485 Neoplasm of uncertain behavior of skin: Secondary | ICD-10-CM | POA: Diagnosis not present

## 2020-04-13 DIAGNOSIS — C44329 Squamous cell carcinoma of skin of other parts of face: Secondary | ICD-10-CM | POA: Diagnosis not present

## 2020-04-19 DIAGNOSIS — C44329 Squamous cell carcinoma of skin of other parts of face: Secondary | ICD-10-CM | POA: Diagnosis not present

## 2020-04-20 DIAGNOSIS — M1711 Unilateral primary osteoarthritis, right knee: Secondary | ICD-10-CM | POA: Diagnosis not present

## 2020-08-15 DIAGNOSIS — H903 Sensorineural hearing loss, bilateral: Secondary | ICD-10-CM | POA: Diagnosis not present

## 2020-08-15 DIAGNOSIS — H838X3 Other specified diseases of inner ear, bilateral: Secondary | ICD-10-CM | POA: Diagnosis not present

## 2020-08-15 DIAGNOSIS — H6123 Impacted cerumen, bilateral: Secondary | ICD-10-CM | POA: Diagnosis not present

## 2020-09-13 DIAGNOSIS — Z8546 Personal history of malignant neoplasm of prostate: Secondary | ICD-10-CM | POA: Diagnosis not present

## 2020-09-20 DIAGNOSIS — R232 Flushing: Secondary | ICD-10-CM | POA: Diagnosis not present

## 2020-09-20 DIAGNOSIS — R351 Nocturia: Secondary | ICD-10-CM | POA: Diagnosis not present

## 2020-09-20 DIAGNOSIS — E349 Endocrine disorder, unspecified: Secondary | ICD-10-CM | POA: Diagnosis not present

## 2020-09-20 DIAGNOSIS — Z8546 Personal history of malignant neoplasm of prostate: Secondary | ICD-10-CM | POA: Diagnosis not present

## 2020-11-02 DIAGNOSIS — Z23 Encounter for immunization: Secondary | ICD-10-CM | POA: Diagnosis not present

## 2021-02-21 DIAGNOSIS — E782 Mixed hyperlipidemia: Secondary | ICD-10-CM | POA: Diagnosis not present

## 2021-02-21 DIAGNOSIS — Z1331 Encounter for screening for depression: Secondary | ICD-10-CM | POA: Diagnosis not present

## 2021-02-21 DIAGNOSIS — E663 Overweight: Secondary | ICD-10-CM | POA: Diagnosis not present

## 2021-02-21 DIAGNOSIS — M1711 Unilateral primary osteoarthritis, right knee: Secondary | ICD-10-CM | POA: Diagnosis not present

## 2021-02-21 DIAGNOSIS — Z0000 Encounter for general adult medical examination without abnormal findings: Secondary | ICD-10-CM | POA: Diagnosis not present

## 2021-02-21 DIAGNOSIS — M25561 Pain in right knee: Secondary | ICD-10-CM | POA: Diagnosis not present

## 2021-02-21 DIAGNOSIS — M1991 Primary osteoarthritis, unspecified site: Secondary | ICD-10-CM | POA: Diagnosis not present

## 2021-02-21 DIAGNOSIS — Z1389 Encounter for screening for other disorder: Secondary | ICD-10-CM | POA: Diagnosis not present

## 2021-02-21 DIAGNOSIS — Z6825 Body mass index (BMI) 25.0-25.9, adult: Secondary | ICD-10-CM | POA: Diagnosis not present

## 2021-02-21 DIAGNOSIS — N3281 Overactive bladder: Secondary | ICD-10-CM | POA: Diagnosis not present

## 2021-03-08 DIAGNOSIS — M1711 Unilateral primary osteoarthritis, right knee: Secondary | ICD-10-CM | POA: Diagnosis not present

## 2021-03-08 DIAGNOSIS — M25561 Pain in right knee: Secondary | ICD-10-CM | POA: Diagnosis not present

## 2021-03-14 DIAGNOSIS — Z8546 Personal history of malignant neoplasm of prostate: Secondary | ICD-10-CM | POA: Diagnosis not present

## 2021-03-21 DIAGNOSIS — R351 Nocturia: Secondary | ICD-10-CM | POA: Diagnosis not present

## 2021-03-21 DIAGNOSIS — R232 Flushing: Secondary | ICD-10-CM | POA: Diagnosis not present

## 2021-03-21 DIAGNOSIS — M1711 Unilateral primary osteoarthritis, right knee: Secondary | ICD-10-CM | POA: Diagnosis not present

## 2021-03-21 DIAGNOSIS — Z8546 Personal history of malignant neoplasm of prostate: Secondary | ICD-10-CM | POA: Diagnosis not present

## 2021-03-21 DIAGNOSIS — M25561 Pain in right knee: Secondary | ICD-10-CM | POA: Diagnosis not present

## 2021-03-21 DIAGNOSIS — E349 Endocrine disorder, unspecified: Secondary | ICD-10-CM | POA: Diagnosis not present

## 2021-03-28 DIAGNOSIS — M25561 Pain in right knee: Secondary | ICD-10-CM | POA: Diagnosis not present

## 2021-03-28 DIAGNOSIS — M1711 Unilateral primary osteoarthritis, right knee: Secondary | ICD-10-CM | POA: Diagnosis not present

## 2021-04-04 DIAGNOSIS — M25561 Pain in right knee: Secondary | ICD-10-CM | POA: Diagnosis not present

## 2021-04-04 DIAGNOSIS — M1711 Unilateral primary osteoarthritis, right knee: Secondary | ICD-10-CM | POA: Diagnosis not present

## 2021-05-30 DIAGNOSIS — Z23 Encounter for immunization: Secondary | ICD-10-CM | POA: Diagnosis not present

## 2021-07-26 DIAGNOSIS — Z23 Encounter for immunization: Secondary | ICD-10-CM | POA: Diagnosis not present

## 2021-08-02 DIAGNOSIS — E663 Overweight: Secondary | ICD-10-CM | POA: Diagnosis not present

## 2021-08-02 DIAGNOSIS — E782 Mixed hyperlipidemia: Secondary | ICD-10-CM | POA: Diagnosis not present

## 2021-08-02 DIAGNOSIS — Z6825 Body mass index (BMI) 25.0-25.9, adult: Secondary | ICD-10-CM | POA: Diagnosis not present

## 2021-08-02 DIAGNOSIS — M1991 Primary osteoarthritis, unspecified site: Secondary | ICD-10-CM | POA: Diagnosis not present

## 2021-08-30 ENCOUNTER — Telehealth: Payer: Self-pay | Admitting: Internal Medicine

## 2021-08-30 NOTE — Telephone Encounter (Signed)
[facility] faxed a referral and it's with Loma Sousa for review.

## 2021-08-30 NOTE — Telephone Encounter (Signed)
Pt came to front desk saying that Larene Pickett had faxed [place] a referral for a colonoscopy on 08/06/2021. I told him that I haven't received one from them and that his last colonoscopy was 2017 and SF recommended another in 10 years and he was on the recall list for 2027. He said he wasn't haven't any problem, but had concerns because he had had prostate cancer and a friend of his also had prostate cancer and 4-5 years later developed colon cancer. I told patient that I would check with Larene Pickett about the referral. Would he need a NV or OV or does he need to fill out the questionnaire form first?  [phone]

## 2021-09-06 ENCOUNTER — Telehealth: Payer: Self-pay | Admitting: *Deleted

## 2021-09-06 NOTE — Telephone Encounter (Signed)
[facility] CALLED AND ASKED WHY THIS PATIENT IS NOT DUE FOR ANOTHER TCS UNTIL 10 YEARS WHEN HE HAD 3 POLYPS REMOVED FROM HIS LAST TCS AND THE REPORT SAYS 5-10 YEARS NEXT TCS.   PLEASE ADVISE

## 2021-09-06 NOTE — Telephone Encounter (Signed)
According to our records, Dr. Oneida Alar recommended 10 year repeat.  He may be at risk of insurance not paying since it is not recommended yet.  Routing to Roseanne Kaufman, NP to see her thoughts on this.

## 2021-09-10 NOTE — Telephone Encounter (Signed)
Pt made aware he needs ov to discuss further.  Ov scheduled for 10/18/2021 at 3:00 with Aliene Altes, PA-C.

## 2021-09-10 NOTE — Telephone Encounter (Signed)
Due to age, I recommend office visit to discuss if he desires colonoscopy.

## 2021-09-12 DIAGNOSIS — Z8546 Personal history of malignant neoplasm of prostate: Secondary | ICD-10-CM | POA: Diagnosis not present

## 2021-09-17 ENCOUNTER — Encounter: Payer: Self-pay | Admitting: Gastroenterology

## 2021-09-19 DIAGNOSIS — E349 Endocrine disorder, unspecified: Secondary | ICD-10-CM | POA: Diagnosis not present

## 2021-09-19 DIAGNOSIS — Z8546 Personal history of malignant neoplasm of prostate: Secondary | ICD-10-CM | POA: Diagnosis not present

## 2021-09-19 DIAGNOSIS — R351 Nocturia: Secondary | ICD-10-CM | POA: Diagnosis not present

## 2021-09-24 ENCOUNTER — Ambulatory Visit: Payer: Medicare Other

## 2021-09-24 ENCOUNTER — Other Ambulatory Visit: Payer: Self-pay

## 2021-09-24 ENCOUNTER — Encounter: Payer: Self-pay | Admitting: Orthopedic Surgery

## 2021-09-24 ENCOUNTER — Ambulatory Visit (INDEPENDENT_AMBULATORY_CARE_PROVIDER_SITE_OTHER): Payer: Medicare Other | Admitting: Orthopedic Surgery

## 2021-09-24 VITALS — BP 152/68 | HR 73 | Ht 68.0 in | Wt 166.6 lb

## 2021-09-24 DIAGNOSIS — M1711 Unilateral primary osteoarthritis, right knee: Secondary | ICD-10-CM | POA: Diagnosis not present

## 2021-09-24 DIAGNOSIS — G8929 Other chronic pain: Secondary | ICD-10-CM

## 2021-09-24 MED ORDER — MELOXICAM 7.5 MG PO TABS: 7.5000 mg | ORAL_TABLET | Freq: Every day | ORAL | 5 refills | Status: DC

## 2021-09-24 NOTE — Progress Notes (Signed)
Chief Complaint  Patient presents with   New Patient (Initial Visit)   Knee Pain    RT knee    HPI: This is an 81 year old male referred to [place] by Dr. Riley Kill for right knee pain  Patient has been to [facility] twice has been to [facility] for gel shot injection is also had a cortisone injection and bracing he was prescribed Plavix but rarely takes it  He still working in the [employer]scape [employer] business and very active.  He complains of medial knee pain.  He wears a knee sleeve at times  He says he does not take the Plavix because it made him bleed too much  Past Medical History:  Diagnosis Date   Anticoagulant long-term use    plavix   Arthritis    Diverticulosis of colon    GERD (gastroesophageal reflux disease)    High triglycerides    History of colon polyps    History of kidney stones    History of TIA (transient ischemic attack)    10-28-2017 per pt 2002 and one episode with blurring vision approx. 2013 that is when he started plavix   Prostate cancer [facility]) urologist-  dr wrenn/  oncologist-  dr Tammi Klippel   dx 06-11-2017--  Stage T2bN0M0, Gleason 5+4, PSA 16.20, 29cc-- started ADT therapy 06-23-2017,  10-21-2017 PSA down to 0.54;  scheduled for radioactive seed implants 11-04-2017 , after this plan external beam radiation therapy   Rectal pain    post hemorroid surgery 09-03-2017   Renal cyst, right    Splenic artery aneurysm ([place])    incidental finding from ct done in 2012, 0.7cm;  referred to dr early (vascular), whom monitor pt up until 2014, now monitored by pcp , per last ct 2017 in epic, measures 1.3cm   Wears hearing aid in both ears     BP (!) 152/68    Pulse 73    Ht 5\' 8"  (1.727 m)    Wt 166 lb 9.6 oz (75.6 kg)    BMI 25.33 kg/m    General appearance: Well-developed well-nourished no gross deformities  Cardiovascular normal pulse and perfusion normal color without edema  Neurologically no sensation loss or deficits or pathologic reflexes  Psychological: Awake  alert and oriented x3 mood and affect normal  Skin no lacerations or ulcerations no nodularity no palpable masses, no erythema or nodularity  Musculoskeletal: Right knee slight varus medial joint line tenderness no effusion 3-1 25 range of motion stable  Imaging are x-rays from the office show medial compartment narrowing mild varus moderate arthritis and mild varus deformity  A/P  We do have notes from [facility] from his visit there were radiographs showed arthritis moderate and he was given a gel injection  Discussed with him possibilities of meloxicam which is an anti-inflammatory if he is not taking the Plavix he does not want to have surgery right now he was interested in possibly getting experimental procedure that [facility] is offering to replace the cartilage in the knee but he is not a candidate really for that  We decided to proceed with the meloxicam as long as he does not take the Plavix  See [place] as needed  Meds ordered this encounter  Medications   meloxicam (MOBIC) 7.5 MG tablet    Sig: Take 1 tablet (7.5 mg total) by mouth daily.    Dispense:  30 tablet    Refill:  5

## 2021-09-24 NOTE — Patient Instructions (Signed)
You will need to get clearance from the doctor who prescribes your Plavix for knee surgery. Discuss with Dr Gerarda Fraction  Total Knee Replacement Total knee replacement is a procedure to replace the damaged knee joint with an artificial (prosthetic) knee joint. The purpose of this surgery is to reduce knee pain and improve knee function. The prosthetic knee joint (prosthesis) may be made of metal, plastic, or ceramic. It replaces parts of the thigh bone (femur), lower leg bone (tibia), and kneecap (patella) that are removed during the procedure. Tell a health care provider about: Any allergies you have. All medicines you are taking, including vitamins, herbs, eye drops, creams, and over-the-counter medicines. Any problems you or family members have had with anesthetic medicines. Any blood disorders you have. Any surgeries you have had. Any medical conditions you have. Whether you are pregnant or may be pregnant. What are the risks? Generally, this is a safe procedure. However, problems may occur, including: Infection. Bleeding. A blood clot that forms in your leg. The clot may break loose and travel to your lungs (pulmonary embolism). Allergic reactions to medicines. Damage to nerves or other structures. Problems with the knee, such as: Decreased range of motion of the knee. Instability of the knee. Loosening of the prosthetic joint. Knee pain that does not go away. What happens before the procedure? Staying hydrated Follow instructions from your health care provider about hydration, which may include: Up to 2 hours before the procedure - you may continue to drink clear liquids, such as water, clear fruit juice, black coffee, and plain tea.  Eating and drinking restrictions Follow instructions from your health care provider about eating and drinking, which may include: 8 hours before the procedure - stop eating heavy meals or foods, such as meat, fried foods, or fatty foods. 6 hours before  the procedure - stop eating light meals or foods, such as toast or cereal. 6 hours before the procedure - stop drinking milk or drinks that contain milk. 2 hours before the procedure - stop drinking clear liquids. Medicines Ask your health care provider about: Changing or stopping your regular medicines. This is especially important if you are taking diabetes medicines or blood thinners. Taking medicines such as aspirin and ibuprofen. These medicines can thin your blood. Do not take these medicines unless your health care provider tells you to take them. Taking over-the-counter medicines, vitamins, herbs, and supplements. Tests and exams You may have: A physical exam. Tests, such as X-rays, MRI, CT scan, or bone scan. A blood or urine sample taken. Lifestyle  Keep your body and teeth clean. Germs from anywhere in your body can travel to your new joint and infect it. Tell your health care provider if you: Plan to have dental care and routine cleanings. Develop any skin infections. If your health care provider prescribes physical therapy, do exercises as instructed. If you are overweight, work with your health care provider to reach a safe weight. Extra weight can put pressure on your knee. Do not use any products that contain nicotine or tobacco for at least 4 weeks before the procedure. These products include cigarettes, chewing tobacco, and vaping devices, such as e-cigarettes. If you need help quitting, ask your health care provider. Surgery safety Ask your health care provider: How your surgery site will be marked. What steps will be taken to help prevent infection. These steps may include: Removing hair at the surgery site. Washing skin with a germ-killing soap. Taking antibiotic medicine. General instructions Do not shave  your legs just before surgery. If hair removal is needed, it will be done in the hospital. Plan to have a responsible adult take you home from the hospital or  clinic. Plan to have a responsible adult care for you for the time you are told after you leave the hospital or clinic. This is important. It is recommended that you have someone to help care for you for at least 4-6 weeks after your procedure. What happens during the procedure? An IV will be inserted into one of your veins. You will be given one or more of the following: A medicine to help you relax (sedative). A medicine that is injected into an area of your body near the nerves to numb everything below the injection site (peripheral nerve block). A medicine that is injected into your spine to numb the area below and slightly above the injection site (spinal anesthetic). A medicine to make you fall asleep (general anesthetic). An incision will be made in your knee. Damaged cartilage and bone will be removed from your femur, tibia, and patella. Parts of the prosthesis (liners) will be placed over the areas of bone and cartilage that were removed. A metal liner will be placed over your femur, and plastic liners will be placed over your tibia and the underside of your patella. Your incision will be closed with stitches (sutures), staples, skin glue, or adhesive strips. A bandage (dressing) will be placed over your incision. The procedure may vary among health care providers and hospitals. What happens after the procedure? Your blood pressure, heart rate, breathing rate, and blood oxygen level will be monitored until you leave the hospital or clinic. You will be given medicines to help manage pain. You may: Continue to receive fluids through an IV. Have to wear compression stockings. These stockings help to prevent blood clots and reduce swelling in your legs. You will be encouraged to move. A physical therapist will teach you how to use crutches or a walker and how to exercise at home. Do not drive until your health care provider approves. Summary Total knee replacement is a procedure to  replace the knee joint with an artificial knee joint. Before the procedure, follow instructions from your health care provider about eating and drinking. Plan to have a responsible adult take you home from the hospital or clinic. This information is not intended to replace advice given to you by your health care provider. Make sure you discuss any questions you have with your health care provider. Document Revised: 01/04/2020 Document Reviewed: 01/04/2020 Elsevier Patient Education  [place].

## 2021-10-18 ENCOUNTER — Ambulatory Visit: Payer: Medicare Other | Admitting: Gastroenterology

## 2021-12-04 NOTE — Progress Notes (Signed)
? ?Referring Provider:  Redmond School, MD ?Primary Care Physician:  Redmond School, MD ?Primary Gastroenterologist:  Dr. Abbey Chatters ? ?Chief Complaint  ?Patient presents with  ? Colonoscopy  ? ? ?HPI:   ?Louis Barnett is a 81 y.o. male presenting today at the request of  Redmond School, MD for consult colonoscopy.  Recommended office visit due to age. ? ?Colonoscopy in February 2017 with a 6 mm ascending polyp removed (tubular adenoma), 2 small 2-4 mm polyps removed from the rectum (hyperplastic polyps), moderate internal hemorrhoids, large external hemorrhoids.  ? ?Per guideline recommendations, colonoscopy should be repeated in 7-10 years for 1-2 tubular adenomas less than 10 mm in size.  ? ?Today:  ?It will be 5 years in August that he was diagnosed with prostate cancer and thought maybe he should have his colonoscopy updated. Doing well at this time.  Had seeds placed, radiation treatment, and took Lupron for a couple of years.  Now just following with urology every 6 months. ? ?Since his treatments, has had some fluctuations in bowel habits from mild constipation to diarrhea. This has improved overall. Usually with 1 BM daily. 25% of the time, he will have 2 Bms daily. No watery stools. Just mushy at times.  Other times, he may skip a day between bowel movements and will take a stool softener which works well for him. ? ?No other GI concerns.  Denies and abdominal pain, BRBPR, melena, unintentional weight loss, nausea, vomiting, reflux symptoms, or dysphagia. No Fhx of colon cancer.  ? ?Plavix has been on hold for about 4 months because he was bleeding every time he would bump himself.  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 ? ?Past Medical History:  ?Diagnosis Date  ? Anticoagulant long-term use   ? plavix  ? Arthritis   ? Diverticulosis of colon   ? GERD (gastroesophageal reflux disease)   ? High triglycerides   ? History of colon polyps   ? History of kidney stones   ? History of TIA (transient  ischemic attack)   ? 10-28-2017 per pt 2002 and one episode with blurring vision approx. 2013 that is when he started plavix  ? Prostate cancer [facility]) urologist-  dr wrenn/  oncologist-  dr manning  ? dx 06-11-2017--  Stage T2bN0M0, Gleason 5+4, PSA 16.20, 29cc-- started ADT therapy 06-23-2017,  10-21-2017 PSA down to 0.54;  scheduled for radioactive seed implants 11-04-2017 , after this plan external beam radiation therapy  ? Rectal pain   ? post hemorroid surgery 09-03-2017  ? Renal cyst, right   ? Splenic artery aneurysm ([place])   ? incidental finding from ct done in 2012, 0.7cm;  referred to dr early (vascular), whom monitor pt up until 2014, now monitored by pcp , per last ct 2017 in epic, measures 1.3cm  ? Wears hearing aid in both ears   ? ? ?Past Surgical History:  ?Procedure Laterality Date  ? COLONOSCOPY  08/12/2006  ? [facility];mild divertlculosis in colon/sessile polyps in the rectum  ? COLONOSCOPY N/A 09/18/2015  ? Surgeon: Danie Binder, MD;  6 mm ascending polyp removed (tubular adenoma), 2 small 2-4 mm polyps removed from the rectum (hyperplastic polyps), moderate internal hemorrhoids, large external hemorrhoids.  ? CYSTOSCOPY N/A 11/04/2017  ? Procedure: CYSTOSCOPY FLEXIBLE;  Surgeon: Irine Seal, MD;  Location: [facility];  Service: Urology;  Laterality: N/A;  NO SEEDS FOUND IN BLADDER  ? CYSTOSCOPY WITH RETROGRADE PYELOGRAM, URETEROSCOPY AND STENT PLACEMENT Right 07-05-2005  dr Alinda Money  [facility]  ? CYSTOSCOPY WITH URETEROSCOPY  07/18/2011  ? left ureteroscopic stone extraction by dr borden  ? EXTRACORPOREAL SHOCK WAVE LITHOTRIPSY Right 03-11-2016   dr Pilar Jarvis  ? HEMORRHOID SURGERY N/A 09/03/2017  ? Procedure: EXTENSIVE HEMORRHOIDECTOMY;  Surgeon: Virl Cagey, MD;  Location: [facility];  Service: General;  Laterality: N/A;  ? PROSTATE BIOPSY  06-11-2017   dr Jeffie Pollock office  ? RADIOACTIVE SEED IMPLANT N/A 11/04/2017  ? Procedure: RADIOACTIVE SEED IMPLANT/BRACHYTHERAPY IMPLANT;  Surgeon:  Irine Seal, MD;  Location: [facility];  Service: Urology;  Laterality: N/A;   49  SEEDS IMPLANTED  ? RIGHT URETEROSCOPIC STONE EXTRACTION  07-25-2005   dr Alinda Money  [facility]  ? SPACE OAR INSTILLATION N/A 11/04/2017  ? Procedure: SPACE OAR INSTILLATION;  Surgeon: Irine Seal, MD;  Location: [facility];  Service: Urology;  Laterality: N/A;  ? ? ?Current Outpatient Medications  ?Medication Sig Dispense Refill  ? docusate sodium (COLACE) 100 MG capsule Take 100 mg by mouth daily as needed.    ? Ibuprofen-diphenhydrAMINE HCl 200-25 MG CAPS Take 2 tablets by mouth at bedtime as needed (sleep).    ? clopidogrel (PLAVIX) 75 MG tablet Take 75 mg by mouth daily.  (Patient not taking: Reported on 12/06/2021)    ? ?No current facility-administered medications for this visit.  ? ? ?Allergies as of 12/06/2021 - Review Complete 12/06/2021  ?Allergen Reaction Noted  ? Ivp dye [iodinated contrast media] Nausea Only 08/29/2017  ? Penicillins Rash and Other (See Comments) 07/15/2011  ? ? ?Family History  ?Problem Relation Age of Onset  ? Heart disease Father   ?     heart attack  ? Other Mother   ?     varicose veins  ? Diabetes Brother   ? Heart disease Brother   ?     heart attack  ? Colon cancer Neg Hx   ? Cancer Neg Hx   ? ? ?Social History  ? ?Socioeconomic History  ? Marital status: Married  ?  Spouse name: Not on file  ? Number of children: Not on file  ? Years of education: Not on file  ? Highest education level: Not on file  ?Occupational History  ?  Comment: working in Architect  ?Tobacco Use  ? Smoking status: Never  ? Smokeless tobacco: Never  ?Vaping Use  ? Vaping Use: Never used  ?Substance and Sexual Activity  ? Alcohol use: No  ? Drug use: No  ? Sexual activity: Never  ?Other Topics Concern  ? Not on file  ?Social History Narrative  ? Has a daughter, Cecille Rubin.  ? ?Social Determinants of Health  ? ?Financial Resource Strain: Not on file  ?Food Insecurity: Not on file  ?Transportation Needs: Not  on file  ?Physical Activity: Not on file  ?Stress: Not on file  ?Social Connections: Not on file  ?Intimate Partner Violence: Not on file  ? ? ?Review of Systems: ?Gen: Denies any fever, chills, cold or flulike symptoms, presyncope, syncope. ?CV: Denies chest pain or heart palpitations.  ?Resp: Denies shortness of breath or cough. ?GI: See HPI ?GU : Denies urinary burning, urinary frequency, urinary hesitancy ?MS: Denies joint pain. ?Derm: Denies rash. ?Psych: Denies depression, anxiety. ?Heme: See HPI ? ?Physical Exam: ?BP 132/60   Pulse 74   Temp (!) 97.4 ?F (36.3 ?C) (Temporal)   Ht '5\' 7"'$  (1.702 m)   Wt 166 lb 3.2 oz (75.4 kg)   BMI 26.03 kg/m?  ?  General:   Alert and oriented. Pleasant and cooperative. Well-nourished and well-developed.  ?Head:  Normocephalic and atraumatic. ?Eyes:  Without icterus, sclera clear and conjunctiva pink.  ?Ears:  Normal auditory acuity. ?Lungs:  Clear to auscultation bilaterally. No wheezes, rales, or rhonchi. No distress.  ?Heart:  S1, S2 present without murmurs appreciated.  ?Abdomen:  +BS, soft, non-tender and non-distended. No HSM noted. No guarding or rebound. No masses appreciated.  ?Rectal:  Deferred  ?Msk:  Symmetrical without gross deformities. Normal posture. ?Extremities:  Without edema. ?Neurologic:  Alert and  oriented x4;  grossly normal neurologically. ?Skin:  Intact without significant lesions or rashes. ?Psych:  Normal mood and affect. ? ? ? ?Assessment:  ?81 year old male with history of TIA previously on Plavix which is currently on hold, splenic artery aneurysm, prostate cancer, colon polyps, presenting today to discuss the possibility of scheduling a colonoscopy.  His last colonoscopy was in February 2017 with one 6 mm tubular adenoma, 2 small hyperplastic polyps, moderate internal hemorrhoids, and large external hemorrhoids.  Per guidelines, colonoscopy should be repeated in 7-10 years.  Patient thought maybe he should have his colonoscopy updated since he  was diagnosed with prostate cancer about 5 years ago.  Clinically, he is doing well and has no significant GI symptoms.  He has been treated for prostate cancer with radiation, seed implants, and Lupron, now underg

## 2021-12-06 ENCOUNTER — Ambulatory Visit (INDEPENDENT_AMBULATORY_CARE_PROVIDER_SITE_OTHER): Payer: Medicare Other | Admitting: Gastroenterology

## 2021-12-06 ENCOUNTER — Encounter: Payer: Self-pay | Admitting: Gastroenterology

## 2021-12-06 VITALS — BP 132/60 | HR 74 | Temp 97.4°F | Ht 67.0 in | Wt 166.2 lb

## 2021-12-06 DIAGNOSIS — Z86010 Personal history of colonic polyps: Secondary | ICD-10-CM | POA: Diagnosis not present

## 2021-12-06 DIAGNOSIS — R198 Other specified symptoms and signs involving the digestive system and abdomen: Secondary | ICD-10-CM | POA: Insufficient documentation

## 2021-12-06 NOTE — Patient Instructions (Addendum)
Start Benefiber 2 teaspoons daily x 2 weeks then increase to twice daily to help with alternating constipation and diarrhea. ? ?Your next colonoscopy is due in February 2024.  We will plan to follow-up with you in January to discuss arranging your colonoscopy.  Please call us if you have any significant change in bowel habits, blood in your stools, black stools, unintentional weight loss, or other GI concerns before your next visit. ? ?It was a pleasure meeting you today! ? ?Aliene Altes, PA-C ?[facility] ? ?

## 2022-03-06 DIAGNOSIS — Z8546 Personal history of malignant neoplasm of prostate: Secondary | ICD-10-CM | POA: Diagnosis not present

## 2022-03-06 DIAGNOSIS — E349 Endocrine disorder, unspecified: Secondary | ICD-10-CM | POA: Diagnosis not present

## 2022-03-07 DIAGNOSIS — L255 Unspecified contact dermatitis due to plants, except food: Secondary | ICD-10-CM | POA: Diagnosis not present

## 2022-03-13 DIAGNOSIS — E230 Hypopituitarism: Secondary | ICD-10-CM | POA: Diagnosis not present

## 2022-03-13 DIAGNOSIS — Z8546 Personal history of malignant neoplasm of prostate: Secondary | ICD-10-CM | POA: Diagnosis not present

## 2022-04-15 DIAGNOSIS — E230 Hypopituitarism: Secondary | ICD-10-CM | POA: Diagnosis not present

## 2022-04-15 DIAGNOSIS — Z8546 Personal history of malignant neoplasm of prostate: Secondary | ICD-10-CM | POA: Diagnosis not present

## 2022-04-22 DIAGNOSIS — E230 Hypopituitarism: Secondary | ICD-10-CM | POA: Diagnosis not present

## 2022-05-10 DIAGNOSIS — R051 Acute cough: Secondary | ICD-10-CM | POA: Diagnosis not present

## 2022-05-10 DIAGNOSIS — J069 Acute upper respiratory infection, unspecified: Secondary | ICD-10-CM | POA: Diagnosis not present

## 2022-05-10 DIAGNOSIS — Z20822 Contact with and (suspected) exposure to covid-19: Secondary | ICD-10-CM | POA: Diagnosis not present

## 2022-05-17 DIAGNOSIS — H9203 Otalgia, bilateral: Secondary | ICD-10-CM | POA: Diagnosis not present

## 2022-05-17 DIAGNOSIS — R059 Cough, unspecified: Secondary | ICD-10-CM | POA: Diagnosis not present

## 2022-05-17 DIAGNOSIS — J9801 Acute bronchospasm: Secondary | ICD-10-CM | POA: Diagnosis not present

## 2022-06-04 DIAGNOSIS — E230 Hypopituitarism: Secondary | ICD-10-CM | POA: Diagnosis not present

## 2022-06-04 DIAGNOSIS — Z8546 Personal history of malignant neoplasm of prostate: Secondary | ICD-10-CM | POA: Diagnosis not present

## 2022-07-01 DIAGNOSIS — Z23 Encounter for immunization: Secondary | ICD-10-CM | POA: Diagnosis not present

## 2022-07-03 DIAGNOSIS — Z8546 Personal history of malignant neoplasm of prostate: Secondary | ICD-10-CM | POA: Diagnosis not present

## 2022-07-03 DIAGNOSIS — E230 Hypopituitarism: Secondary | ICD-10-CM | POA: Diagnosis not present

## 2022-07-08 DIAGNOSIS — E230 Hypopituitarism: Secondary | ICD-10-CM | POA: Diagnosis not present

## 2022-07-08 DIAGNOSIS — Z8546 Personal history of malignant neoplasm of prostate: Secondary | ICD-10-CM | POA: Diagnosis not present

## 2022-07-08 DIAGNOSIS — R232 Flushing: Secondary | ICD-10-CM | POA: Diagnosis not present

## 2022-07-15 DIAGNOSIS — Z23 Encounter for immunization: Secondary | ICD-10-CM | POA: Diagnosis not present

## 2022-07-15 DIAGNOSIS — D0421 Carcinoma in situ of skin of right ear and external auricular canal: Secondary | ICD-10-CM | POA: Diagnosis not present

## 2022-08-17 DIAGNOSIS — J329 Chronic sinusitis, unspecified: Secondary | ICD-10-CM | POA: Diagnosis not present

## 2022-08-17 DIAGNOSIS — J029 Acute pharyngitis, unspecified: Secondary | ICD-10-CM | POA: Diagnosis not present

## 2022-08-17 DIAGNOSIS — Z20822 Contact with and (suspected) exposure to covid-19: Secondary | ICD-10-CM | POA: Diagnosis not present

## 2022-09-04 DIAGNOSIS — L570 Actinic keratosis: Secondary | ICD-10-CM | POA: Diagnosis not present

## 2022-09-04 DIAGNOSIS — Z85828 Personal history of other malignant neoplasm of skin: Secondary | ICD-10-CM | POA: Diagnosis not present

## 2022-09-04 DIAGNOSIS — Z08 Encounter for follow-up examination after completed treatment for malignant neoplasm: Secondary | ICD-10-CM | POA: Diagnosis not present

## 2022-09-04 DIAGNOSIS — X32XXXD Exposure to sunlight, subsequent encounter: Secondary | ICD-10-CM | POA: Diagnosis not present

## 2022-09-04 DIAGNOSIS — L648 Other androgenic alopecia: Secondary | ICD-10-CM | POA: Diagnosis not present

## 2022-09-18 DIAGNOSIS — N183 Chronic kidney disease, stage 3 unspecified: Secondary | ICD-10-CM | POA: Diagnosis not present

## 2022-09-18 DIAGNOSIS — G250 Essential tremor: Secondary | ICD-10-CM | POA: Diagnosis not present

## 2022-09-18 DIAGNOSIS — Z6826 Body mass index (BMI) 26.0-26.9, adult: Secondary | ICD-10-CM | POA: Diagnosis not present

## 2022-09-18 DIAGNOSIS — E230 Hypopituitarism: Secondary | ICD-10-CM | POA: Diagnosis not present

## 2022-09-18 DIAGNOSIS — H6123 Impacted cerumen, bilateral: Secondary | ICD-10-CM | POA: Diagnosis not present

## 2022-09-18 DIAGNOSIS — E663 Overweight: Secondary | ICD-10-CM | POA: Diagnosis not present

## 2022-09-18 DIAGNOSIS — M1991 Primary osteoarthritis, unspecified site: Secondary | ICD-10-CM | POA: Diagnosis not present

## 2022-09-30 DIAGNOSIS — Z8546 Personal history of malignant neoplasm of prostate: Secondary | ICD-10-CM | POA: Diagnosis not present

## 2022-09-30 DIAGNOSIS — E230 Hypopituitarism: Secondary | ICD-10-CM | POA: Diagnosis not present

## 2022-10-07 DIAGNOSIS — Z8546 Personal history of malignant neoplasm of prostate: Secondary | ICD-10-CM | POA: Diagnosis not present

## 2022-10-07 DIAGNOSIS — R351 Nocturia: Secondary | ICD-10-CM | POA: Diagnosis not present

## 2022-10-07 DIAGNOSIS — E230 Hypopituitarism: Secondary | ICD-10-CM | POA: Diagnosis not present

## 2022-11-04 DIAGNOSIS — K045 Chronic apical periodontitis: Secondary | ICD-10-CM | POA: Diagnosis not present

## 2022-11-20 ENCOUNTER — Encounter (HOSPITAL_COMMUNITY): Payer: Self-pay | Admitting: *Deleted

## 2022-11-20 ENCOUNTER — Emergency Department (HOSPITAL_COMMUNITY): Payer: Medicare Other

## 2022-11-20 ENCOUNTER — Other Ambulatory Visit: Payer: Self-pay

## 2022-11-20 ENCOUNTER — Emergency Department (HOSPITAL_COMMUNITY)
Admission: EM | Admit: 2022-11-20 | Discharge: 2022-11-20 | Disposition: A | Discharge status: Home / Self Care | Payer: Medicare Other | Attending: Emergency Medicine | Admitting: Emergency Medicine

## 2022-11-20 DIAGNOSIS — Z8673 Personal history of transient ischemic attack (TIA), and cerebral infarction without residual deficits: Secondary | ICD-10-CM | POA: Diagnosis not present

## 2022-11-20 DIAGNOSIS — Z7902 Long term (current) use of antithrombotics/antiplatelets: Secondary | ICD-10-CM | POA: Diagnosis not present

## 2022-11-20 DIAGNOSIS — R079 Chest pain, unspecified: Secondary | ICD-10-CM | POA: Diagnosis not present

## 2022-11-20 DIAGNOSIS — R0789 Other chest pain: Secondary | ICD-10-CM | POA: Diagnosis not present

## 2022-11-20 DIAGNOSIS — Z8546 Personal history of malignant neoplasm of prostate: Secondary | ICD-10-CM | POA: Insufficient documentation

## 2022-11-20 DIAGNOSIS — E663 Overweight: Secondary | ICD-10-CM | POA: Diagnosis not present

## 2022-11-20 DIAGNOSIS — R030 Elevated blood-pressure reading, without diagnosis of hypertension: Secondary | ICD-10-CM | POA: Diagnosis not present

## 2022-11-20 DIAGNOSIS — Z6827 Body mass index (BMI) 27.0-27.9, adult: Secondary | ICD-10-CM | POA: Diagnosis not present

## 2022-11-20 LAB — CBC
HCT: 45.4 % (ref 39.0–52.0)
Hemoglobin: 15.1 g/dL (ref 13.0–17.0)
MCH: 31.6 pg (ref 26.0–34.0)
MCHC: 33.3 g/dL (ref 30.0–36.0)
MCV: 95 fL (ref 80.0–100.0)
Platelets: 108 10*3/uL — ABNORMAL LOW (ref 150–400)
RBC: 4.78 MIL/uL (ref 4.22–5.81)
RDW: 13 % (ref 11.5–15.5)
WBC: 2.8 10*3/uL — ABNORMAL LOW (ref 4.0–10.5)
nRBC: 0 % (ref 0.0–0.2)

## 2022-11-20 LAB — TROPONIN I (HIGH SENSITIVITY)
Troponin I (High Sensitivity): 3 ng/L (ref <18)
Troponin I (High Sensitivity): 3 ng/L (ref <18)

## 2022-11-20 LAB — BASIC METABOLIC PANEL
Anion gap: 6 (ref 5–15)
BUN: 17 mg/dL (ref 8–23)
CO2: 25 mmol/L (ref 22–32)
Calcium: 8.4 mg/dL — ABNORMAL LOW (ref 8.9–10.3)
Chloride: 106 mmol/L (ref 98–111)
Creatinine, Ser: 1.39 mg/dL — ABNORMAL HIGH (ref 0.61–1.24)
GFR, Estimated: 51 mL/min — ABNORMAL LOW (ref >60)
Glucose, Bld: 109 mg/dL — ABNORMAL HIGH (ref 70–99)
Potassium: 3.8 mmol/L (ref 3.5–5.1)
Sodium: 137 mmol/L (ref 135–145)

## 2022-11-20 NOTE — Discharge Instructions (Addendum)
We evaluated you for your chest pain.  Your EKG and laboratory testing was not suggestive of a heart attack.  Your chest x-ray did not show any signs of a collapsed lung or pneumonia.  Your testing was overall reassuring.  We think it is safe to go home and follow-up with cardiology as an outpatient.  We have placed a referral.  They should call you to help schedule this.  There is a number that you can call listed if you do not hear from anybody.  Please return if you develop any new or worsening symptoms such as productive cough, severe pain, lightheadedness or dizziness, fainting, difficulty breathing, nausea or vomiting, or any other concerning symptoms.

## 2022-11-20 NOTE — ED Provider Notes (Signed)
[place] EMERGENCY DEPARTMENT AT [facility] Provider Note  CSN: [id number] Arrival date & time: 11/20/22 1825  Chief Complaint(s) Chest Pain  HPI Louis Barnett is a 82 y.o. male with prior history of prostate cancer, hyperlipidemia presenting to the emergency department with chest pain.  He reports that he had an episode of chest pain that started around 4 AM.  He reports currently it is resolved.  He denies any other symptoms such as cough, shortness of breath, runny nose, sore throat, lightheadedness or dizziness, diaphoresis, syncope, headaches, back pain.  The pain did not radiate.  He reports the pain is currently resolved.  The pain did not seem exertional or pleuritic when he is having the pain.  Nothing seems to make it better or worse.   Past Medical History Past Medical History:  Diagnosis Date   Anticoagulant long-term use    plavix   Arthritis    Diverticulosis of colon    GERD (gastroesophageal reflux disease)    High triglycerides    History of colon polyps    History of kidney stones    History of TIA (transient ischemic attack)    10-28-2017 per pt 2002 and one episode with blurring vision approx. 2013 that is when he started plavix   Prostate cancer urologist-  dr wrenn/  oncologist-  dr Kathrynn Running   dx 06-11-2017--  Stage T2bN0M0, Gleason 5+4, PSA 16.20, 29cc-- started ADT therapy 06-23-2017,  10-21-2017 PSA down to 0.54;  scheduled for radioactive seed implants 11-04-2017 , after this plan external beam radiation therapy   Rectal pain    post hemorroid surgery 09-03-2017   Renal cyst, right    Splenic artery aneurysm    incidental finding from ct done in 2012, 0.7cm;  referred to dr early (vascular), whom monitor pt up until 2014, now monitored by pcp , per last ct 2017 in epic, measures 1.3cm   Wears hearing aid in both ears    Patient Active Problem List   Diagnosis Date Noted   Alternating constipation and diarrhea 12/06/2021   Internal and external  hemorrhoids without complication    Malignant neoplasm of prostate 07/09/2017   History of colonic polyps 09/04/2015   Aneurysm of splenic artery 08/27/2011   Ureteral stone 07/18/2011   Home Medication(s) Prior to Admission medications   Medication Sig Start Date End Date Taking? Authorizing Provider  clopidogrel (PLAVIX) 75 MG tablet Take 75 mg by mouth daily.  Patient not taking: Reported on 12/06/2021    [provider]  docusate sodium (COLACE) 100 MG capsule Take 100 mg by mouth daily as needed.    [provider]  Ibuprofen-diphenhydrAMINE HCl 200-25 MG CAPS Take 2 tablets by mouth at bedtime as needed (sleep).    [provider]  Past Surgical History Past Surgical History:  Procedure Laterality Date   COLONOSCOPY  08/12/2006   Jenkins;mild divertlculosis in colon/sessile polyps in the rectum   COLONOSCOPY N/A 09/18/2015   Surgeon: West Bali, MD;  6 mm ascending polyp removed (tubular adenoma), 2 small 2-4 mm polyps removed from the rectum (hyperplastic polyps), moderate internal hemorrhoids, large external hemorrhoids.   CYSTOSCOPY N/A 11/04/2017   Procedure: CYSTOSCOPY FLEXIBLE;  Surgeon: Bjorn Pippin, MD;  Location: [facility];  Service: Urology;  Laterality: N/A;  NO SEEDS FOUND IN BLADDER   CYSTOSCOPY WITH RETROGRADE PYELOGRAM, URETEROSCOPY AND STENT PLACEMENT Right 07-05-2005   dr Laverle Patter  [facility]   CYSTOSCOPY WITH URETEROSCOPY  07/18/2011   left ureteroscopic stone extraction by dr borden   EXTRACORPOREAL SHOCK WAVE LITHOTRIPSY Right 03-11-2016   dr Sherryl Barters   HEMORRHOID SURGERY N/A 09/03/2017   Procedure: EXTENSIVE HEMORRHOIDECTOMY;  Surgeon: Lucretia Roers, MD;  Location: [facility];  Service: General;  Laterality: N/A;   PROSTATE BIOPSY  06-11-2017   dr Annabell Howells office   RADIOACTIVE SEED IMPLANT N/A  11/04/2017   Procedure: RADIOACTIVE SEED IMPLANT/BRACHYTHERAPY IMPLANT;  Surgeon: Bjorn Pippin, MD;  Location: [facility];  Service: Urology;  Laterality: N/A;   49  SEEDS IMPLANTED   RIGHT URETEROSCOPIC STONE EXTRACTION  07-25-2005   dr Laverle Patter  [facility]   SPACE OAR INSTILLATION N/A 11/04/2017   Procedure: SPACE OAR INSTILLATION;  Surgeon: Bjorn Pippin, MD;  Location: [facility];  Service: Urology;  Laterality: N/A;   Family History Family History  Problem Relation Age of Onset   Heart disease Father        heart attack   Other Mother        varicose veins   Diabetes Brother    Heart disease Brother        heart attack   Colon cancer Neg Hx    Cancer Neg Hx     Social History Social History   Tobacco Use   Smoking status: Never   Smokeless tobacco: Never  Vaping Use   Vaping Use: Never used  Substance Use Topics   Alcohol use: No   Drug use: No   Allergies Ivp dye [iodinated contrast media] and Penicillins  Review of Systems Review of Systems  All other systems reviewed and are negative.   Physical Exam Vital Signs  I have reviewed the triage vital signs BP 117/88   Pulse (!) 59   Temp (!) 97.3 F (36.3 C) (Temporal)   Resp 17   Ht 5\' 7"  (1.702 m)   Wt 72.6 kg   SpO2 97%   BMI 25.06 kg/m  Physical Exam Vitals and nursing note reviewed.  Constitutional:      General: He is not in acute distress.    Appearance: Normal appearance.  HENT:     Mouth/Throat:     Mouth: Mucous membranes are moist.  Eyes:     Conjunctiva/sclera: Conjunctivae normal.  Cardiovascular:     Rate and Rhythm: Normal rate and regular rhythm.  Pulmonary:     Effort: Pulmonary effort is normal. No respiratory distress.     Breath sounds: Normal breath sounds.  Abdominal:     General: Abdomen is flat.     Palpations: Abdomen is soft.     Tenderness: There is no abdominal tenderness.  Musculoskeletal:     Right lower leg: No edema.     Left lower leg:  No edema.  Skin:  General: Skin is warm and dry.     Capillary Refill: Capillary refill takes less than 2 seconds.  Neurological:     Mental Status: He is alert and oriented to person, place, and time. Mental status is at baseline.  Psychiatric:        Mood and Affect: Mood normal.        Behavior: Behavior normal.     ED Results and Treatments Labs (all labs ordered are listed, but only abnormal results are displayed) Labs Reviewed  BASIC METABOLIC PANEL - Abnormal; Notable for the following components:      Result Value   Glucose, Bld 109 (*)    Creatinine, Ser 1.39 (*)    Calcium 8.4 (*)    GFR, Estimated 51 (*)    All other components within normal limits  CBC - Abnormal; Notable for the following components:   WBC 2.8 (*)    Platelets 108 (*)    All other components within normal limits  TROPONIN I (HIGH SENSITIVITY)  TROPONIN I (HIGH SENSITIVITY)                                                                                                                          Radiology DG Chest 2 View  Result Date: 11/20/2022 CLINICAL DATA:  Chest pain EXAM: CHEST - 2 VIEW COMPARISON:  08/15/2017 FINDINGS: The heart size and mediastinal contours are within normal limits. Both lungs are clear. The visualized skeletal structures are unremarkable. IMPRESSION: No active cardiopulmonary disease. Electronically Signed   By: Ernie Avena M.D.   On: 11/20/2022 19:15    Pertinent labs & imaging results that were available during my care of the patient were reviewed by me and considered in my medical decision making (see MDM for details).  Medications Ordered in ED Medications - No data to display                                                                                                                                   Procedures Procedures  (including critical care time)  Medical Decision Making / ED Course   MDM:  82 year old male presenting to the emergency  department chest pain.  Patient well-appearing, physical exam is reassuring.  EKG reassuring without signs of acute ischemia.  Low concern for ACS.  Symptoms atypical, no associated symptoms and EKG is reassuring.  Initial troponin is normal.  Will trend.  Also doubt pulmonary embolism, no tachycardia or hypoxia, denies any recent travel or surgeries, no clinical findings concerning for DVT.  Chest x-ray without evidence of pneumonia, pneumothorax.  No nausea or vomiting to suggest esophageal pathology.  Pain mild and already resolved, very low concern for dissection.  Will reassess.  Given age, if delta troponin negative, likely follow-up with cardiology, will place referral for expedited follow-up.  Clinical Course as of 11/20/22 2250  Wed Nov 20, 2022  2249 Troponin negative x2. Patient continues to be pain free. Unclear cause of symptoms but given age cardiology referral placed and discussed with patient. Workup overall reassuring. Will discharge patient to home. All questions answered. Patient comfortable with plan of discharge. Return precautions discussed with patient and specified on the after visit summary.  [WS]    Clinical Course User Index [WS] Suezanne Jacquet, Jerilee Field, MD     Additional history obtained: -Additional history obtained from family -External records from outside source obtained and reviewed including: Chart review including previous notes, labs, imaging, consultation notes including prior PMD and UC notes   Lab Tests: -I ordered, reviewed, and interpreted labs.   The pertinent results include:   Labs Reviewed  BASIC METABOLIC PANEL - Abnormal; Notable for the following components:      Result Value   Glucose, Bld 109 (*)    Creatinine, Ser 1.39 (*)    Calcium 8.4 (*)    GFR, Estimated 51 (*)    All other components within normal limits  CBC - Abnormal; Notable for the following components:   WBC 2.8 (*)    Platelets 108 (*)    All other components within normal  limits  TROPONIN I (HIGH SENSITIVITY)  TROPONIN I (HIGH SENSITIVITY)    Notable for mild leukopenia of unclear significance, stable signs of CKD  EKG   EKG Interpretation  Date/Time:  Wednesday November 20 2022 18:32:51 EDT Ventricular Rate:  69 PR Interval:  162 QRS Duration: 88 QT Interval:  376 QTC Calculation: 402 R Axis:   54 Text Interpretation: Normal sinus rhythm Normal ECG When compared with ECG of 15-Aug-2017 11:21, No significant change was found Confirmed by Alvino Blood ([id number]) on 11/20/2022 7:02:01 PM         Imaging Studies ordered: I ordered imaging studies including CXR On my interpretation imaging demonstrates no acute process I independently visualized and interpreted imaging. I agree with the radiologist interpretation   Medicines ordered and prescription drug management: No orders of the defined types were placed in this encounter.   -I have reviewed the patients home medicines and have made adjustments as needed    Cardiac Monitoring: The patient was maintained on a cardiac monitor.  I personally viewed and interpreted the cardiac monitored which showed an underlying rhythm of: NSR    Reevaluation: After the interventions noted above, I reevaluated the patient and found that their symptoms have resolved  Co morbidities that complicate the patient evaluation  Past Medical History:  Diagnosis Date   Anticoagulant long-term use    plavix   Arthritis    Diverticulosis of colon    GERD (gastroesophageal reflux disease)    High triglycerides    History of colon polyps    History of kidney stones    History of TIA (transient ischemic attack)    10-28-2017 per pt 2002 and one episode with blurring vision approx. 2013 that is when he started plavix   Prostate cancer urologist-  dr wrenn/  oncologist-  dr Kathrynn Running   dx 06-11-2017--  Stage T2bN0M0, Gleason 5+4, PSA 16.20, 29cc-- started ADT therapy 06-23-2017,  10-21-2017 PSA down to 0.54;   scheduled for radioactive seed implants 11-04-2017 , after this plan external beam radiation therapy   Rectal pain    post hemorroid surgery 09-03-2017   Renal cyst, right    Splenic artery aneurysm    incidental finding from ct done in 2012, 0.7cm;  referred to dr early (vascular), whom monitor pt up until 2014, now monitored by pcp , per last ct 2017 in epic, measures 1.3cm   Wears hearing aid in both ears       Dispostion: Disposition decision including need for hospitalization was considered, and patient discharged from emergency department.    Final Clinical Impression(s) / ED Diagnoses Final diagnoses:  Atypical chest pain     This chart was dictated using voice recognition software.  Despite best efforts to proofread,  errors can occur which can change the documentation meaning.    Lonell Grandchild, MD 11/20/22 2250

## 2022-11-20 NOTE — ED Triage Notes (Signed)
Pt with mid CP since 0400 this morning.  Denies SOB or N/V

## 2022-11-20 NOTE — ED Notes (Signed)
Introduced myself to patient. Wife is at bedside. Patient reports no chest pain at this time.

## 2022-12-05 ENCOUNTER — Encounter: Payer: Self-pay | Admitting: Internal Medicine

## 2022-12-05 ENCOUNTER — Encounter: Payer: Self-pay | Admitting: *Deleted

## 2022-12-05 ENCOUNTER — Ambulatory Visit: Payer: Medicare Other | Attending: Internal Medicine | Admitting: Internal Medicine

## 2022-12-05 VITALS — BP 117/72 | HR 68 | Ht 67.0 in | Wt 165.2 lb

## 2022-12-05 DIAGNOSIS — R079 Chest pain, unspecified: Secondary | ICD-10-CM | POA: Insufficient documentation

## 2022-12-05 DIAGNOSIS — R011 Cardiac murmur, unspecified: Secondary | ICD-10-CM

## 2022-12-05 NOTE — Patient Instructions (Addendum)
Medication Instructions:  Your physician recommends that you continue on your current medications as directed. Please refer to the Current Medication list given to you today.  Labwork: none  Testing/Procedures: Your physician has requested that you have an echocardiogram. Echocardiography is a painless test that uses sound waves to create images of your heart. It provides your doctor with information about the size and shape of your heart and how well your heart's chambers and valves are working. This procedure takes approximately one hour. There are no restrictions for this procedure. Please do NOT wear cologne, perfume, aftershave, or lotions (deodorant is allowed). Please arrive 15 minutes prior to your appointment time. Your physician has requested that you have a lexiscan myoview. For further information please visit [email or web address]. Please follow instruction sheet, as given.  Follow-Up: Your physician recommends that you schedule a follow-up appointment in: pending  Any Other Special Instructions Will Be Listed Below (If Applicable).  If you need a refill on your cardiac medications before your next appointment, please call your pharmacy. 

## 2022-12-05 NOTE — Progress Notes (Signed)
Cardiology Office Note  Date: 12/05/2022   ID: Louis Barnett, DOB [birth year], MRN [id number]  PCP:  Elfredia Nevins, MD  Cardiologist:  Marjo Bicker, MD Electrophysiologist:  None   Reason for Office Visit:   History of Present Illness: Louis Barnett is a 82 y.o. male known to have possible TIA was referred to cardiology clinic for evaluation of chest discomfort.  Around 2 weeks ago, patient had repeated burping followed by chest discomfort lasting for 1 to 2 hours. His wife wanted him to go to the urgent care and he did. EKG showed no ischemia and high-sensitivity troponins were within normal limits.  He reported that he had blurry vision in his right eye many years ago for which he was diagnosed with possible TIA and placed on Plavix monotherapy.  He does self discontinued Plavix due to significant bruising.  He denies symptoms of angina or DOE.  No other symptoms of dizziness, syncope or leg swelling.  Past Medical History:  Diagnosis Date   Anticoagulant long-term use    plavix   Arthritis    Diverticulosis of colon    GERD (gastroesophageal reflux disease)    High triglycerides    History of colon polyps    History of kidney stones    History of TIA (transient ischemic attack)    10-28-2017 per pt 2002 and one episode with blurring vision approx. 2013 that is when he started plavix   Prostate cancer [facility]) urologist-  dr wrenn/  oncologist-  dr Kathrynn Running   dx 06-11-2017--  Stage T2bN0M0, Gleason 5+4, PSA 16.20, 29cc-- started ADT therapy 06-23-2017,  10-21-2017 PSA down to 0.54;  scheduled for radioactive seed implants 11-04-2017 , after this plan external beam radiation therapy   Rectal pain    post hemorroid surgery 09-03-2017   Renal cyst, right    Splenic artery aneurysm (HCC)    incidental finding from ct done in 2012, 0.7cm;  referred to dr early (vascular), whom monitor pt up until 2014, now monitored by pcp , per last ct 2017 in epic, measures 1.3cm   Wears  hearing aid in both ears     Past Surgical History:  Procedure Laterality Date   COLONOSCOPY  08/12/2006   Jenkins;mild divertlculosis in colon/sessile polyps in the rectum   COLONOSCOPY N/A 09/18/2015   Surgeon: West Bali, MD;  6 mm ascending polyp removed (tubular adenoma), 2 small 2-4 mm polyps removed from the rectum (hyperplastic polyps), moderate internal hemorrhoids, large external hemorrhoids.   CYSTOSCOPY N/A 11/04/2017   Procedure: CYSTOSCOPY FLEXIBLE;  Surgeon: Bjorn Pippin, MD;  Location: [facility];  Service: Urology;  Laterality: N/A;  NO SEEDS FOUND IN BLADDER   CYSTOSCOPY WITH RETROGRADE PYELOGRAM, URETEROSCOPY AND STENT PLACEMENT Right 07-05-2005   dr Laverle Patter  [facility]   CYSTOSCOPY WITH URETEROSCOPY  07/18/2011   left ureteroscopic stone extraction by dr borden   EXTRACORPOREAL SHOCK WAVE LITHOTRIPSY Right 03-11-2016   dr Sherryl Barters   HEMORRHOID SURGERY N/A 09/03/2017   Procedure: EXTENSIVE HEMORRHOIDECTOMY;  Surgeon: Lucretia Roers, MD;  Location: [facility];  Service: General;  Laterality: N/A;   PROSTATE BIOPSY  06-11-2017   dr Annabell Howells office   RADIOACTIVE SEED IMPLANT N/A 11/04/2017   Procedure: RADIOACTIVE SEED IMPLANT/BRACHYTHERAPY IMPLANT;  Surgeon: Bjorn Pippin, MD;  Location: [facility];  Service: Urology;  Laterality: N/A;   49  SEEDS IMPLANTED   RIGHT URETEROSCOPIC STONE EXTRACTION  07-25-2005   dr Laverle Patter  [facility]  SPACE OAR INSTILLATION N/A 11/04/2017   Procedure: SPACE OAR INSTILLATION;  Surgeon: Bjorn Pippin, MD;  Location: [facility];  Service: Urology;  Laterality: N/A;    Current Outpatient Medications  Medication Sig Dispense Refill   Ibuprofen-diphenhydrAMINE HCl 200-25 MG CAPS Take 2 tablets by mouth at bedtime as needed (sleep).     tamsulosin (FLOMAX) 0.4 MG CAPS capsule Take 0.4 mg by mouth daily.     Testosterone 20.25 MG/ACT (1.62%) GEL 1 Pump Topical Every Morning     clopidogrel (PLAVIX) 75 MG tablet Take  75 mg by mouth daily.  (Patient not taking: Reported on 12/06/2021)     No current facility-administered medications for this visit.   Allergies:  Ivp dye [iodinated contrast media] and Penicillins   Social History: The patient  reports that he has never smoked. He has never used smokeless tobacco. He reports that he does not drink alcohol and does not use drugs.   Family History: The patient's family history includes Diabetes in his brother; Heart disease in his brother and father; Other in his mother.   ROS:  Please see the history of present illness. Otherwise, complete review of systems is positive for none  All other systems are reviewed and negative.   Physical Exam: VS:  BP 117/72   Pulse 68   Ht 5\' 7"  (1.702 m)   Wt 165 lb 3.2 oz (74.9 kg)   SpO2 98%   BMI 25.87 kg/m , BMI Body mass index is 25.87 kg/m.  Wt Readings from Last 3 Encounters:  12/05/22 165 lb 3.2 oz (74.9 kg)  11/20/22 160 lb (72.6 kg)  12/06/21 166 lb 3.2 oz (75.4 kg)    General: Patient appears comfortable at rest. HEENT: Conjunctiva and lids normal, oropharynx clear with moist mucosa. Neck: Supple, no elevated JVP or carotid bruits, no thyromegaly. Lungs: Clear to auscultation, nonlabored breathing at rest. Cardiac: Regular rate and rhythm, murmur present, ?  Diastolic. Abdomen: Soft, nontender, no hepatomegaly, bowel sounds present, no guarding or rebound. Extremities: No pitting edema, distal pulses 2+. Skin: Warm and dry. Musculoskeletal: No kyphosis. Neuropsychiatric: Alert and oriented x3, affect grossly appropriate.  Recent Labwork: 11/20/2022: BUN 17; Creatinine, Ser 1.39; Hemoglobin 15.1; Platelets 108; Potassium 3.8; Sodium 137  No results found for: "CHOL", "TRIG", "HDL", "CHOLHDL", "VLDL", "LDLCALC", "LDLDIRECT"  Assessment and Plan: Patient is a 82 year old M with possible TIA was referred to cardiology clinic for evaluation of chest discomfort.  # Chest discomfort likely secondary to  GERD however CAD cannot be completely ruled out due to advanced age: Missy Sabins.  # Murmur: ??diastolic, obtain 2D echocardiogram.   I have spent a total of 45 minutes with patient reviewing chart, EKGs, labs and examining patient as well as establishing an assessment and plan that was discussed with the patient.  > 50% of time was spent in direct patient care.    Medication Adjustments/Labs and Tests Ordered: Current medicines are reviewed at length with the patient today.  Concerns regarding medicines are outlined above.   Tests Ordered: Orders Placed This Encounter  Procedures   NM Myocar Multi W/Spect W/Wall Motion / EF   ECHOCARDIOGRAM COMPLETE    Medication Changes: No orders of the defined types were placed in this encounter.   Disposition:  Follow up  pending results  Signed Jeryl Umholtz Verne Spurr, MD, 12/05/2022 2:40 PM    [facility] [address] [place], [place], [place] [address]

## 2022-12-06 ENCOUNTER — Ambulatory Visit (HOSPITAL_COMMUNITY)
Admission: RE | Admit: 2022-12-06 | Discharge: 2022-12-06 | Disposition: A | Discharge status: Home / Self Care | Payer: Medicare Other | Source: Ambulatory Visit | Attending: Internal Medicine | Admitting: Internal Medicine

## 2022-12-06 DIAGNOSIS — R011 Cardiac murmur, unspecified: Secondary | ICD-10-CM | POA: Diagnosis not present

## 2022-12-06 LAB — ECHOCARDIOGRAM COMPLETE
AR max vel: 2.94 cm2
AV Area VTI: 2.93 cm2
AV Area mean vel: 2.87 cm2
AV Mean grad: 2 mmHg
AV Peak grad: 3.5 mmHg
Ao pk vel: 0.94 m/s
Area-P 1/2: 3.54 cm2
Calc EF: 62 %
P 1/2 time: 657 msec
S' Lateral: 2.3 cm
Single Plane A2C EF: 60.6 %
Single Plane A4C EF: 60.6 %

## 2022-12-06 NOTE — Progress Notes (Signed)
  Echocardiogram 2D Echocardiogram has been performed.  Janalyn Harder 12/06/2022, 11:08 AM

## 2022-12-09 ENCOUNTER — Ambulatory Visit (HOSPITAL_COMMUNITY)
Admission: RE | Admit: 2022-12-09 | Discharge: 2022-12-09 | Disposition: A | Discharge status: Home / Self Care | Payer: Medicare Other | Source: Ambulatory Visit | Attending: Internal Medicine | Admitting: Internal Medicine

## 2022-12-09 ENCOUNTER — Ambulatory Visit (HOSPITAL_BASED_OUTPATIENT_CLINIC_OR_DEPARTMENT_OTHER)
Admission: RE | Admit: 2022-12-09 | Discharge: 2022-12-09 | Disposition: A | Discharge status: Home / Self Care | Payer: Medicare Other | Source: Ambulatory Visit | Attending: Internal Medicine | Admitting: Internal Medicine

## 2022-12-09 DIAGNOSIS — R079 Chest pain, unspecified: Secondary | ICD-10-CM | POA: Diagnosis not present

## 2022-12-09 LAB — NM MYOCAR MULTI W/SPECT W/WALL MOTION / EF
Base ST Depression (mm): 0 mm
LV dias vol: 75 mL (ref 62–150)
LV sys vol: 23 mL
Nuc Stress EF: 69 %
Peak HR: 84 {beats}/min
RATE: 0.3
Rest HR: 59 {beats}/min
Rest Nuclear Isotope Dose: 9.5 mCi
SDS: 0
SRS: 1
SSS: 1
ST Depression (mm): 0 mm
Stress Nuclear Isotope Dose: 32 mCi
TID: 0.96

## 2022-12-09 MED ORDER — TECHNETIUM TC 99M TETROFOSMIN IV KIT
9.5000 | PACK | Freq: Once | INTRAVENOUS | Status: AC | PRN
  Administered 2022-12-09: 9.5 via INTRAVENOUS

## 2022-12-09 MED ORDER — TECHNETIUM TC 99M TETROFOSMIN IV KIT
32.0000 | PACK | Freq: Once | INTRAVENOUS | Status: AC | PRN
  Administered 2022-12-09: 32 via INTRAVENOUS

## 2022-12-09 MED ORDER — SODIUM CHLORIDE FLUSH 0.9 % IV SOLN
INTRAVENOUS | Status: AC
  Administered 2022-12-09: 10 mL via INTRAVENOUS
  Filled 2022-12-09: qty 10

## 2022-12-09 MED ORDER — REGADENOSON 0.4 MG/5ML IV SOLN
INTRAVENOUS | Status: AC
  Administered 2022-12-09: 0.4 mg via INTRAVENOUS
  Filled 2022-12-09: qty 5

## 2023-01-03 ENCOUNTER — Encounter: Payer: Self-pay | Admitting: *Deleted

## 2023-01-22 DIAGNOSIS — E230 Hypopituitarism: Secondary | ICD-10-CM | POA: Diagnosis not present

## 2023-01-22 DIAGNOSIS — Z8546 Personal history of malignant neoplasm of prostate: Secondary | ICD-10-CM | POA: Diagnosis not present

## 2023-01-29 DIAGNOSIS — R311 Benign essential microscopic hematuria: Secondary | ICD-10-CM | POA: Diagnosis not present

## 2023-01-29 DIAGNOSIS — N403 Nodular prostate with lower urinary tract symptoms: Secondary | ICD-10-CM | POA: Diagnosis not present

## 2023-01-29 DIAGNOSIS — Z8546 Personal history of malignant neoplasm of prostate: Secondary | ICD-10-CM | POA: Diagnosis not present

## 2023-01-29 DIAGNOSIS — R3912 Poor urinary stream: Secondary | ICD-10-CM | POA: Diagnosis not present

## 2023-01-29 DIAGNOSIS — E230 Hypopituitarism: Secondary | ICD-10-CM | POA: Diagnosis not present

## 2023-02-17 DIAGNOSIS — R311 Benign essential microscopic hematuria: Secondary | ICD-10-CM | POA: Diagnosis not present

## 2023-03-05 DIAGNOSIS — Z08 Encounter for follow-up examination after completed treatment for malignant neoplasm: Secondary | ICD-10-CM | POA: Diagnosis not present

## 2023-03-05 DIAGNOSIS — L570 Actinic keratosis: Secondary | ICD-10-CM | POA: Diagnosis not present

## 2023-03-05 DIAGNOSIS — X32XXXD Exposure to sunlight, subsequent encounter: Secondary | ICD-10-CM | POA: Diagnosis not present

## 2023-03-05 DIAGNOSIS — Z85828 Personal history of other malignant neoplasm of skin: Secondary | ICD-10-CM | POA: Diagnosis not present

## 2023-03-12 DIAGNOSIS — N3041 Irradiation cystitis with hematuria: Secondary | ICD-10-CM | POA: Diagnosis not present

## 2023-03-12 DIAGNOSIS — R3121 Asymptomatic microscopic hematuria: Secondary | ICD-10-CM | POA: Diagnosis not present

## 2023-06-17 DIAGNOSIS — L570 Actinic keratosis: Secondary | ICD-10-CM | POA: Diagnosis not present

## 2023-06-17 DIAGNOSIS — X32XXXD Exposure to sunlight, subsequent encounter: Secondary | ICD-10-CM | POA: Diagnosis not present

## 2023-07-15 DIAGNOSIS — Z23 Encounter for immunization: Secondary | ICD-10-CM | POA: Diagnosis not present

## 2023-08-12 DIAGNOSIS — N183 Chronic kidney disease, stage 3 unspecified: Secondary | ICD-10-CM | POA: Diagnosis not present

## 2023-08-12 DIAGNOSIS — E663 Overweight: Secondary | ICD-10-CM | POA: Diagnosis not present

## 2023-08-12 DIAGNOSIS — M1991 Primary osteoarthritis, unspecified site: Secondary | ICD-10-CM | POA: Diagnosis not present

## 2023-08-12 DIAGNOSIS — Z6825 Body mass index (BMI) 25.0-25.9, adult: Secondary | ICD-10-CM | POA: Diagnosis not present

## 2023-08-21 DIAGNOSIS — M25561 Pain in right knee: Secondary | ICD-10-CM | POA: Diagnosis not present

## 2023-08-21 DIAGNOSIS — G8929 Other chronic pain: Secondary | ICD-10-CM | POA: Diagnosis not present

## 2023-08-26 DIAGNOSIS — G8929 Other chronic pain: Secondary | ICD-10-CM | POA: Diagnosis not present

## 2023-08-26 DIAGNOSIS — M25561 Pain in right knee: Secondary | ICD-10-CM | POA: Diagnosis not present

## 2023-09-19 DIAGNOSIS — Z125 Encounter for screening for malignant neoplasm of prostate: Secondary | ICD-10-CM | POA: Diagnosis not present

## 2023-09-19 DIAGNOSIS — N183 Chronic kidney disease, stage 3 unspecified: Secondary | ICD-10-CM | POA: Diagnosis not present

## 2023-09-19 DIAGNOSIS — E782 Mixed hyperlipidemia: Secondary | ICD-10-CM | POA: Diagnosis not present

## 2023-09-19 DIAGNOSIS — E230 Hypopituitarism: Secondary | ICD-10-CM | POA: Diagnosis not present

## 2023-09-19 DIAGNOSIS — E559 Vitamin D deficiency, unspecified: Secondary | ICD-10-CM | POA: Diagnosis not present

## 2023-09-19 DIAGNOSIS — D518 Other vitamin B12 deficiency anemias: Secondary | ICD-10-CM | POA: Diagnosis not present

## 2023-10-17 DIAGNOSIS — M25561 Pain in right knee: Secondary | ICD-10-CM | POA: Diagnosis not present

## 2023-10-17 DIAGNOSIS — G8929 Other chronic pain: Secondary | ICD-10-CM | POA: Diagnosis not present

## 2023-10-17 DIAGNOSIS — M1711 Unilateral primary osteoarthritis, right knee: Secondary | ICD-10-CM | POA: Diagnosis not present

## 2023-10-20 DIAGNOSIS — M25761 Osteophyte, right knee: Secondary | ICD-10-CM | POA: Diagnosis not present

## 2023-10-20 DIAGNOSIS — G8918 Other acute postprocedural pain: Secondary | ICD-10-CM | POA: Diagnosis not present

## 2023-10-20 DIAGNOSIS — M1711 Unilateral primary osteoarthritis, right knee: Secondary | ICD-10-CM | POA: Diagnosis not present

## 2023-10-30 DIAGNOSIS — Z96651 Presence of right artificial knee joint: Secondary | ICD-10-CM | POA: Diagnosis not present

## 2023-11-03 DIAGNOSIS — M1711 Unilateral primary osteoarthritis, right knee: Secondary | ICD-10-CM | POA: Diagnosis not present

## 2023-11-06 DIAGNOSIS — M1711 Unilateral primary osteoarthritis, right knee: Secondary | ICD-10-CM | POA: Diagnosis not present

## 2023-11-11 DIAGNOSIS — M1711 Unilateral primary osteoarthritis, right knee: Secondary | ICD-10-CM | POA: Diagnosis not present

## 2023-11-13 DIAGNOSIS — M1711 Unilateral primary osteoarthritis, right knee: Secondary | ICD-10-CM | POA: Diagnosis not present

## 2023-11-18 DIAGNOSIS — M1711 Unilateral primary osteoarthritis, right knee: Secondary | ICD-10-CM | POA: Diagnosis not present

## 2023-11-20 DIAGNOSIS — M1711 Unilateral primary osteoarthritis, right knee: Secondary | ICD-10-CM | POA: Diagnosis not present

## 2023-11-25 DIAGNOSIS — M1711 Unilateral primary osteoarthritis, right knee: Secondary | ICD-10-CM | POA: Diagnosis not present

## 2023-12-02 DIAGNOSIS — Z96651 Presence of right artificial knee joint: Secondary | ICD-10-CM | POA: Diagnosis not present

## 2023-12-02 DIAGNOSIS — M1711 Unilateral primary osteoarthritis, right knee: Secondary | ICD-10-CM | POA: Diagnosis not present

## 2023-12-04 DIAGNOSIS — M1711 Unilateral primary osteoarthritis, right knee: Secondary | ICD-10-CM | POA: Diagnosis not present

## 2023-12-11 DIAGNOSIS — M1711 Unilateral primary osteoarthritis, right knee: Secondary | ICD-10-CM | POA: Diagnosis not present

## 2023-12-15 DIAGNOSIS — M1711 Unilateral primary osteoarthritis, right knee: Secondary | ICD-10-CM | POA: Diagnosis not present

## 2023-12-18 DIAGNOSIS — M1711 Unilateral primary osteoarthritis, right knee: Secondary | ICD-10-CM | POA: Diagnosis not present

## 2023-12-23 DIAGNOSIS — M1711 Unilateral primary osteoarthritis, right knee: Secondary | ICD-10-CM | POA: Diagnosis not present

## 2023-12-26 DIAGNOSIS — M1711 Unilateral primary osteoarthritis, right knee: Secondary | ICD-10-CM | POA: Diagnosis not present

## 2023-12-30 DIAGNOSIS — M1711 Unilateral primary osteoarthritis, right knee: Secondary | ICD-10-CM | POA: Diagnosis not present

## 2024-01-01 DIAGNOSIS — M1711 Unilateral primary osteoarthritis, right knee: Secondary | ICD-10-CM | POA: Diagnosis not present

## 2024-01-06 DIAGNOSIS — M1711 Unilateral primary osteoarthritis, right knee: Secondary | ICD-10-CM | POA: Diagnosis not present

## 2024-01-13 DIAGNOSIS — Z96651 Presence of right artificial knee joint: Secondary | ICD-10-CM | POA: Diagnosis not present

## 2024-01-15 DIAGNOSIS — M1711 Unilateral primary osteoarthritis, right knee: Secondary | ICD-10-CM | POA: Diagnosis not present

## 2024-01-16 DIAGNOSIS — E230 Hypopituitarism: Secondary | ICD-10-CM | POA: Diagnosis not present

## 2024-01-16 DIAGNOSIS — Z8546 Personal history of malignant neoplasm of prostate: Secondary | ICD-10-CM | POA: Diagnosis not present

## 2024-01-20 DIAGNOSIS — M1711 Unilateral primary osteoarthritis, right knee: Secondary | ICD-10-CM | POA: Diagnosis not present

## 2024-01-22 DIAGNOSIS — M1711 Unilateral primary osteoarthritis, right knee: Secondary | ICD-10-CM | POA: Diagnosis not present

## 2024-01-23 DIAGNOSIS — Z8546 Personal history of malignant neoplasm of prostate: Secondary | ICD-10-CM | POA: Diagnosis not present

## 2024-01-23 DIAGNOSIS — N403 Nodular prostate with lower urinary tract symptoms: Secondary | ICD-10-CM | POA: Diagnosis not present

## 2024-01-23 DIAGNOSIS — R3912 Poor urinary stream: Secondary | ICD-10-CM | POA: Diagnosis not present

## 2024-01-23 DIAGNOSIS — E230 Hypopituitarism: Secondary | ICD-10-CM | POA: Diagnosis not present

## 2024-01-23 DIAGNOSIS — N3041 Irradiation cystitis with hematuria: Secondary | ICD-10-CM | POA: Diagnosis not present

## 2024-01-23 DIAGNOSIS — R311 Benign essential microscopic hematuria: Secondary | ICD-10-CM | POA: Diagnosis not present

## 2024-01-27 DIAGNOSIS — M1711 Unilateral primary osteoarthritis, right knee: Secondary | ICD-10-CM | POA: Diagnosis not present

## 2024-01-29 DIAGNOSIS — M1711 Unilateral primary osteoarthritis, right knee: Secondary | ICD-10-CM | POA: Diagnosis not present

## 2024-02-03 DIAGNOSIS — M1711 Unilateral primary osteoarthritis, right knee: Secondary | ICD-10-CM | POA: Diagnosis not present

## 2024-02-10 DIAGNOSIS — M1711 Unilateral primary osteoarthritis, right knee: Secondary | ICD-10-CM | POA: Diagnosis not present

## 2024-02-12 DIAGNOSIS — M1711 Unilateral primary osteoarthritis, right knee: Secondary | ICD-10-CM | POA: Diagnosis not present

## 2024-02-17 DIAGNOSIS — M1711 Unilateral primary osteoarthritis, right knee: Secondary | ICD-10-CM | POA: Diagnosis not present

## 2024-02-19 DIAGNOSIS — M1711 Unilateral primary osteoarthritis, right knee: Secondary | ICD-10-CM | POA: Diagnosis not present

## 2024-02-24 DIAGNOSIS — M1711 Unilateral primary osteoarthritis, right knee: Secondary | ICD-10-CM | POA: Diagnosis not present

## 2024-02-26 DIAGNOSIS — M1711 Unilateral primary osteoarthritis, right knee: Secondary | ICD-10-CM | POA: Diagnosis not present

## 2024-03-02 DIAGNOSIS — M1711 Unilateral primary osteoarthritis, right knee: Secondary | ICD-10-CM | POA: Diagnosis not present

## 2024-03-09 DIAGNOSIS — X32XXXD Exposure to sunlight, subsequent encounter: Secondary | ICD-10-CM | POA: Diagnosis not present

## 2024-03-09 DIAGNOSIS — L570 Actinic keratosis: Secondary | ICD-10-CM | POA: Diagnosis not present

## 2024-03-10 DIAGNOSIS — N289 Disorder of kidney and ureter, unspecified: Secondary | ICD-10-CM | POA: Diagnosis not present

## 2024-03-10 DIAGNOSIS — Z79899 Other long term (current) drug therapy: Secondary | ICD-10-CM | POA: Diagnosis not present

## 2024-03-10 DIAGNOSIS — E291 Testicular hypofunction: Secondary | ICD-10-CM | POA: Diagnosis not present

## 2024-03-10 DIAGNOSIS — R011 Cardiac murmur, unspecified: Secondary | ICD-10-CM | POA: Diagnosis not present

## 2024-03-10 DIAGNOSIS — Z8546 Personal history of malignant neoplasm of prostate: Secondary | ICD-10-CM | POA: Diagnosis not present

## 2024-03-10 DIAGNOSIS — Z7689 Persons encountering health services in other specified circumstances: Secondary | ICD-10-CM | POA: Diagnosis not present

## 2024-03-31 DIAGNOSIS — R011 Cardiac murmur, unspecified: Secondary | ICD-10-CM | POA: Diagnosis not present

## 2024-03-31 DIAGNOSIS — Z136 Encounter for screening for cardiovascular disorders: Secondary | ICD-10-CM | POA: Diagnosis not present

## 2024-03-31 DIAGNOSIS — N289 Disorder of kidney and ureter, unspecified: Secondary | ICD-10-CM | POA: Diagnosis not present

## 2024-04-07 DIAGNOSIS — D696 Thrombocytopenia, unspecified: Secondary | ICD-10-CM | POA: Diagnosis not present

## 2024-04-07 DIAGNOSIS — E291 Testicular hypofunction: Secondary | ICD-10-CM | POA: Diagnosis not present

## 2024-04-07 DIAGNOSIS — R011 Cardiac murmur, unspecified: Secondary | ICD-10-CM | POA: Diagnosis not present

## 2024-04-07 DIAGNOSIS — N1831 Chronic kidney disease, stage 3a: Secondary | ICD-10-CM | POA: Diagnosis not present

## 2024-04-07 DIAGNOSIS — Z23 Encounter for immunization: Secondary | ICD-10-CM | POA: Diagnosis not present

## 2024-04-07 DIAGNOSIS — R718 Other abnormality of red blood cells: Secondary | ICD-10-CM | POA: Diagnosis not present

## 2024-04-07 DIAGNOSIS — Z8546 Personal history of malignant neoplasm of prostate: Secondary | ICD-10-CM | POA: Diagnosis not present

## 2024-04-24 DIAGNOSIS — Z23 Encounter for immunization: Secondary | ICD-10-CM | POA: Diagnosis not present
